# Patient Record
Sex: Female | Born: 1965
Health system: Southern US, Community
[De-identification: ages and names within clinical notes are randomized; demographics above are authoritative.]

## PROBLEM LIST (undated history)

## (undated) DIAGNOSIS — F32A Depression, unspecified: Secondary | ICD-10-CM

## (undated) DIAGNOSIS — I499 Cardiac arrhythmia, unspecified: Secondary | ICD-10-CM

## (undated) DIAGNOSIS — K5190 Ulcerative colitis, unspecified, without complications: Secondary | ICD-10-CM

## (undated) DIAGNOSIS — F419 Anxiety disorder, unspecified: Secondary | ICD-10-CM

## (undated) DIAGNOSIS — J309 Allergic rhinitis, unspecified: Secondary | ICD-10-CM

## (undated) DIAGNOSIS — I493 Ventricular premature depolarization: Secondary | ICD-10-CM

## (undated) DIAGNOSIS — F329 Major depressive disorder, single episode, unspecified: Secondary | ICD-10-CM

## (undated) HISTORY — DX: Ventricular premature depolarization: I49.3

## (undated) HISTORY — DX: Ulcerative colitis, unspecified, without complications: K51.90

## (undated) HISTORY — DX: Allergic rhinitis, unspecified: J30.9

## (undated) HISTORY — PX: OTHER SURGICAL HISTORY: SHX169

## (undated) HISTORY — PX: COLONOSCOPY: SHX174

---

## 1999-10-06 ENCOUNTER — Other Ambulatory Visit: Admission: RE | Admit: 1999-10-06 | Discharge: 1999-10-06 | Payer: Self-pay | Admitting: Gynecology

## 1999-12-26 ENCOUNTER — Ambulatory Visit (HOSPITAL_COMMUNITY): Admission: RE | Admit: 1999-12-26 | Discharge: 1999-12-26 | Payer: Self-pay | Admitting: Gastroenterology

## 1999-12-26 ENCOUNTER — Encounter (INDEPENDENT_AMBULATORY_CARE_PROVIDER_SITE_OTHER): Payer: Self-pay

## 2000-12-10 ENCOUNTER — Other Ambulatory Visit: Admission: RE | Admit: 2000-12-10 | Discharge: 2000-12-10 | Payer: Self-pay | Admitting: Gynecology

## 2001-06-18 ENCOUNTER — Encounter: Admission: RE | Admit: 2001-06-18 | Discharge: 2001-06-18 | Payer: Self-pay | Admitting: Gynecology

## 2001-06-18 ENCOUNTER — Encounter: Payer: Self-pay | Admitting: Gynecology

## 2001-12-19 ENCOUNTER — Other Ambulatory Visit: Admission: RE | Admit: 2001-12-19 | Discharge: 2001-12-19 | Payer: Self-pay | Admitting: Gynecology

## 2002-12-17 ENCOUNTER — Other Ambulatory Visit: Admission: RE | Admit: 2002-12-17 | Discharge: 2002-12-17 | Payer: Self-pay | Admitting: Gynecology

## 2003-06-17 ENCOUNTER — Encounter: Admission: RE | Admit: 2003-06-17 | Discharge: 2003-06-17 | Payer: Self-pay | Admitting: Family Medicine

## 2003-11-24 ENCOUNTER — Other Ambulatory Visit: Admission: RE | Admit: 2003-11-24 | Discharge: 2003-11-24 | Payer: Self-pay | Admitting: Gynecology

## 2004-01-11 ENCOUNTER — Ambulatory Visit (HOSPITAL_COMMUNITY): Admission: RE | Admit: 2004-01-11 | Discharge: 2004-01-11 | Payer: Self-pay | Admitting: Gastroenterology

## 2004-09-21 ENCOUNTER — Ambulatory Visit (HOSPITAL_COMMUNITY): Admission: RE | Admit: 2004-09-21 | Discharge: 2004-09-21 | Payer: Self-pay | Admitting: Gastroenterology

## 2004-10-03 ENCOUNTER — Other Ambulatory Visit: Admission: RE | Admit: 2004-10-03 | Discharge: 2004-10-03 | Payer: Self-pay | Admitting: Gynecology

## 2005-02-27 ENCOUNTER — Ambulatory Visit (HOSPITAL_COMMUNITY): Admission: RE | Admit: 2005-02-27 | Discharge: 2005-02-27 | Payer: Self-pay | Admitting: Gastroenterology

## 2005-02-27 ENCOUNTER — Encounter (INDEPENDENT_AMBULATORY_CARE_PROVIDER_SITE_OTHER): Payer: Self-pay | Admitting: Specialist

## 2005-03-27 ENCOUNTER — Encounter: Admission: RE | Admit: 2005-03-27 | Discharge: 2005-03-27 | Payer: Self-pay | Admitting: Gynecology

## 2005-04-12 ENCOUNTER — Ambulatory Visit (HOSPITAL_COMMUNITY): Admission: RE | Admit: 2005-04-12 | Discharge: 2005-04-12 | Payer: Self-pay | Admitting: Gynecology

## 2005-09-26 ENCOUNTER — Other Ambulatory Visit: Admission: RE | Admit: 2005-09-26 | Discharge: 2005-09-26 | Payer: Self-pay | Admitting: Gynecology

## 2006-04-02 ENCOUNTER — Encounter: Admission: RE | Admit: 2006-04-02 | Discharge: 2006-04-02 | Payer: Self-pay | Admitting: Gynecology

## 2006-07-17 ENCOUNTER — Ambulatory Visit (HOSPITAL_BASED_OUTPATIENT_CLINIC_OR_DEPARTMENT_OTHER): Admission: RE | Admit: 2006-07-17 | Discharge: 2006-07-17 | Payer: Self-pay | Admitting: Orthopedic Surgery

## 2006-09-12 ENCOUNTER — Other Ambulatory Visit: Admission: RE | Admit: 2006-09-12 | Discharge: 2006-09-12 | Payer: Self-pay | Admitting: Gynecology

## 2007-04-11 ENCOUNTER — Encounter: Admission: RE | Admit: 2007-04-11 | Discharge: 2007-04-11 | Payer: Self-pay | Admitting: Gynecology

## 2007-07-10 ENCOUNTER — Ambulatory Visit (HOSPITAL_COMMUNITY): Admission: RE | Admit: 2007-07-10 | Discharge: 2007-07-10 | Payer: Self-pay | Admitting: Gynecology

## 2007-09-24 ENCOUNTER — Other Ambulatory Visit: Admission: RE | Admit: 2007-09-24 | Discharge: 2007-09-24 | Payer: Self-pay | Admitting: Gynecology

## 2008-04-21 ENCOUNTER — Encounter: Admission: RE | Admit: 2008-04-21 | Discharge: 2008-04-21 | Payer: Self-pay | Admitting: Gynecology

## 2008-05-05 ENCOUNTER — Encounter (INDEPENDENT_AMBULATORY_CARE_PROVIDER_SITE_OTHER): Payer: Self-pay | Admitting: Gastroenterology

## 2008-05-05 ENCOUNTER — Ambulatory Visit (HOSPITAL_COMMUNITY): Admission: RE | Admit: 2008-05-05 | Discharge: 2008-05-05 | Payer: Self-pay | Admitting: Gastroenterology

## 2009-04-01 ENCOUNTER — Encounter: Admission: RE | Admit: 2009-04-01 | Discharge: 2009-04-01 | Payer: Self-pay | Admitting: Gynecology

## 2009-05-14 ENCOUNTER — Encounter: Admission: RE | Admit: 2009-05-14 | Discharge: 2009-05-14 | Payer: Self-pay | Admitting: Family Medicine

## 2009-06-01 ENCOUNTER — Ambulatory Visit (HOSPITAL_COMMUNITY): Admission: RE | Admit: 2009-06-01 | Discharge: 2009-06-01 | Payer: Self-pay | Admitting: Gynecology

## 2010-03-07 ENCOUNTER — Encounter: Admission: RE | Admit: 2010-03-07 | Discharge: 2010-03-07 | Payer: Self-pay | Admitting: Gynecology

## 2010-04-27 ENCOUNTER — Ambulatory Visit (HOSPITAL_COMMUNITY)
Admission: RE | Admit: 2010-04-27 | Discharge: 2010-04-27 | Payer: Self-pay | Source: Home / Self Care | Admitting: Gynecology

## 2010-06-26 ENCOUNTER — Encounter: Payer: Self-pay | Admitting: Gastroenterology

## 2010-08-29 ENCOUNTER — Other Ambulatory Visit: Payer: Self-pay | Admitting: Gastroenterology

## 2010-08-30 ENCOUNTER — Ambulatory Visit
Admission: RE | Admit: 2010-08-30 | Discharge: 2010-08-30 | Disposition: A | Discharge status: Home / Self Care | Payer: BC Managed Care – PPO | Source: Ambulatory Visit | Attending: Gastroenterology | Admitting: Gastroenterology

## 2010-08-31 ENCOUNTER — Other Ambulatory Visit (HOSPITAL_COMMUNITY): Payer: Self-pay | Admitting: Gastroenterology

## 2010-08-31 DIAGNOSIS — R1084 Generalized abdominal pain: Secondary | ICD-10-CM

## 2010-09-09 ENCOUNTER — Encounter (HOSPITAL_COMMUNITY)
Admission: RE | Admit: 2010-09-09 | Discharge: 2010-09-09 | Disposition: A | Discharge status: Home / Self Care | Payer: BC Managed Care – PPO | Source: Ambulatory Visit | Attending: Gastroenterology | Admitting: Gastroenterology

## 2010-09-09 DIAGNOSIS — R1084 Generalized abdominal pain: Secondary | ICD-10-CM

## 2010-09-09 MED ORDER — TECHNETIUM TC 99M MEBROFENIN IV KIT
5.0000 | PACK | Freq: Once | INTRAVENOUS | Status: AC | PRN
  Administered 2010-09-09: 5 via INTRAVENOUS

## 2010-09-14 ENCOUNTER — Other Ambulatory Visit: Payer: Self-pay | Admitting: Gastroenterology

## 2010-10-18 NOTE — Op Note (Signed)
NAMECLARISSE, RODRIGES            ACCOUNT NO.:  [id number]   MEDICAL RECORD NO.:  [id number]          PATIENT TYPE:  AMB   LOCATION:  ENDO                         FACILITY:  [facility]   PHYSICIAN:  Petra Kuba, M.D.    DATE OF BIRTH:  Sep 14, 1965   DATE OF PROCEDURE:  05/05/2008  DATE OF DISCHARGE:                               OPERATIVE REPORT   PROCEDURE:  Colonoscopy with biopsy.   INDICATIONS:  The patient with bright red blood per rectum, history of  ulcerative colitis, almost due for repeat colitis screening.  Consent  was signed after risks, benefits, methods, and options thoroughly  discussed multiple times in the past.   MEDICINES USED:  Benadryl 12.5, Fentanyl 125 mcg, and Versed 12.5 mg.   PROCEDURE:  Rectal inspection is pertinent for external hemorrhoids,  small.  Digital exam was negative.  Pediatric video colonoscope was  inserted, and easily advanced around the colon to the cecum.  This did  not require any abdominal pressure or any position changes.  On  insertion, some mild patchy colitis was seen, although the rectum was  normal.  The cecum was identified by the appendiceal orifice and the  ileocecal valve.  Fiberscope was inserted a short way in the terminal  ileum, which was normal.  Better documentation was obtained.  The scope  was slowly withdrawn.  The cecum, ascending, and majority of the  transverse had very mild colitis with some cobblestoning and some  friability, no obvious ulcerations, and a few scattered biopsies were  obtained and put in the first container.  The area around the splenic  flexure seemed to be normal with some patchy increased erythema and some  shallow ulcerations on the left side of the colon.  Left-sided biopsies  were obtained and put into a separate container.  No masses or polyps  were seen as we slowly withdrew back to the rectum, again the distal  sigmoid and rectum appeared more normal.  Anorectal pull-through and  retroflexion confirmed some tiny hemorrhoids.  Scope was drained and  readvanced towards the left side of the colon.  Air was suctioned and  scope was removed.  The patient tolerated the procedure well.  There was  no obvious immediate complications.   ENDOSCOPIC DIAGNOSES:  1. Tiny hemorrhoids.  2. Patchy colitis, mild with normal rectal, distal sigmoid, and      proximal level around the splenic flexure, status post biopsies      have involved areas separated between left and right.  3. Normal terminal ileum.   PLAN:  Await pathology.  We will re-discuss 5-ASA versus Entocort, which  she has been on before and follow up in 1 month, have her call me sooner  p.r.n.           ______________________________  Petra Kuba, M.D.     MEM/MEDQ  D:  05/05/2008  T:  05/06/2008  Job:  [id number]

## 2010-10-21 NOTE — Procedures (Signed)
[facility]  Patient:    Christina Logan, Christina Logan                   MRN: [id number] Proc. Date: 12/26/99 Adm. Date:  [id number] Disc. Date: [id number] Attending:  Deneen Harts CC:         Leatha Gilding. Mezer, M.D.             Duncan Dull, M.D.                           Procedure Report  PROCEDURE:  Colonoscopy.  INDICATION FOR PROCEDURE:  A 45 year old white female with prior history of inflammatory bowel disease, diagnosed with ulcerative colitis during teenage years, remotely. The patient returns to medical attention because of new onset of hematochezia. This was initially treated symptomatically with Anusol-HC suppositories. Because of persistent symptoms undergoing colonoscopy to reassess inflammatory bowel disease and further diagnose etiology of hematochezia.  DESCRIPTION OF PROCEDURE:  After reviewing the nature of the procedure with the patient including potential risks and complications, and after discussing alternative methods of diagnosis and treatment, informed consent was signed. In particular, we discussed the possibilities of perforation and hemorrhage.  Using the Olympus pediatric video colonoscope, the rectum was intubated after a normal digital examination. The scope was advanced easily around the entire length of the colon to the cecum which was identified by the appendiceal orifice and ileocecal valve. Excellent preparation throughout. The terminal ileum was intubated over its distal 10 cm which appeared entirely normal. Random biopsies were obtained to rule out Crohns disease. The scope was withdrawn into the cecum. The mucosa appeared normal. Random biopsies were obtained. The scope was slowly withdrawn with careful inspection of the entire colon in a retrograde manner including retroflexed view in the rectal vault. No abnormality was noted. Specifically, there was no evidence of mucosal inflammation to suggest inflammatory bowel  disease. Biopsies were taken from the terminal ileum, cecum, and from the rectum for microscopic study. There was no evidence of neoplasia, mucosal inflammation, vascular lesion, or diverticular disease. Retroflexed view in the vault was normal. There was no lesion to explain hematochezia. The colon was decompressed, scope withdrawn.  The patient tolerated the procedure without difficulty being maintained on DataScope monitor and low flow oxygen throughout.  Time 1, technical 2, preparation 1, total score equal 4.  ASSESSMENT: 1. Normal ileoscopy. 2. Normal colonoscopy. 3. Random mucosal biopsies--terminal ileum, cecum, rectum. 4. Hematochezia, probable intermittent anorectal fissure.  RECOMMENDATIONS: 1. Follow-up pathology. 2. Return office visit in 1 month to review pathology results and to reassess    clinical course. 3. Rectal care p.r.n.--include use of Anusol-HC suppository b.i.d. which    have worked in the recent past. DD:  12/26/99 TD:  12/29/99 Job: [id number]

## 2010-10-21 NOTE — Op Note (Signed)
Christina Logan, Christina Logan            ACCOUNT NO.:  [id number]   MEDICAL RECORD NO.:  [id number]          PATIENT TYPE:  AMB   LOCATION:  ENDO                         FACILITY:  [facility]   PHYSICIAN:  Petra Kuba, M.D.    DATE OF BIRTH:  07-Jun-1965   DATE OF PROCEDURE:  02/27/2005  DATE OF DISCHARGE:                                 OPERATIVE REPORT   PROCEDURE PERFORMED:  Colonoscopy with biopsy.   ENDOSCOPIST:  Petra Kuba, M.D.   INDICATIONS FOR PROCEDURE:  Patient with longstanding ulcerative colitis  currently in remission but with left upper quadrant pain.  Consent was  signed after the risks, benefits, methods and options were thoroughly  discussed in the office.   MEDICINES USED:  Demerol 125 mg, Versed 12.5 mg.   DESCRIPTION OF PROCEDURE:  Rectal inspection was pertinent for small  external hemorrhoids.  Digital exam was negative.  A video pediatric  adjustable colonoscope was inserted and easily advanced around the colon to  the cecum.  No signs of colitis or abnormality were seen on insertion.  The  cecum was identified by the appendiceal orifice and the ileocecal valve.  In  fact, the scope was inserted a short ways in the terminal ileum which was  normal.  Photodocumentation and a few random biopsies were obtained and put  in the first container.  The scope was slowly withdrawn.  The colon appeared  normal.  No signs of colitis.  Random right-sided biopsies were obtained and  put in the second container.  As the scope was withdrawn around the left  side of the colon, some random biopsies were obtained and put in the third  container.  There were a few hyperplastic appearing left-sided polyps which  were also cold biopsied and put in the third container.  No other  abnormalities were seen as we slowly withdrew back to the rectum.  Anorectal  pullthrough and retroflexion confirmed some small hemorrhoids.  Scope was  straightened and readvanced a short ways up the left side  of the colon.  Air  was suctioned, scope removed.  The patient tolerated the procedure well.  There was no immediate obvious complication.   ENDOSCOPIC DIAGNOSIS:  1.  Internal and external small hemorrhoids.  2.  Three tiny left-sided hyperplastic appearing polyps cold biopsied.  3.  Otherwise within normal limits to the terminal ileum, status post random      biopsies throughout.   PLAN:  Await pathology.  Follow up p.r.n. or in two to three months.  Might  consider a laparoscope if pain continues to rule out recurrent  endometriosis, adhesions, etc.           ______________________________  Petra Kuba, M.D.     MEM/MEDQ  D:  02/27/2005  T:  02/28/2005  Job:  [id number]   cc:   Duncan Dull, M.D.  Fax: [phone]   Aundra Dubin, M.D.  [address]  [place]  [place] [address]

## 2010-10-21 NOTE — Op Note (Signed)
Christina Logan, Christina Logan            ACCOUNT NO.:  [id number]   MEDICAL RECORD NO.:  [id number]          PATIENT TYPE:  AMB   LOCATION:  DSC                          FACILITY:  [facility]   PHYSICIAN:  Leonides Grills, M.D.     DATE OF BIRTH:  [birth year]   DATE OF PROCEDURE:  07/17/2006  DATE OF DISCHARGE:                               OPERATIVE REPORT   PREOPERATIVE DIAGNOSIS:  Right hallux valgus.   POSTOPERATIVE DIAGNOSIS:  Right hallux valgus.   OPERATION:  1. Right Chevron bunionectomy.  2. Stress x-rays right foot.   ANESTHESIA:  General.   SURGEON:  Leonides Grills, MD   ASSISTANT:  Evlyn Kanner PA-C   ESTIMATED BLOOD LOSS:  Minimal.   TOURNIQUET TIME:  Approximately 40 minutes.   COMPLICATIONS:  None.   DISPOSITION:  Stable to PR.   INDICATIONS:  This is a 45 year old female, with a longstanding right  medial great toe pain as well as a plantar great toe pain and was  interfering with her life to the point where she could not do what she  wants to do.  She was consented for the above procedure.  All risks  including infection, nerve or vessel injury, nonunion, malunion,  hardware irritation, hardware failure, persistent pain, worsening pain,  prolonged recovery, stiffness, arthritis and recurrence of deformity  were all explained, questions were encouraged and answered.   OPERATION:  Patient brought to the operating room, placed in supine  position after adequate general tube anesthesia was administered as well  Ancef 1 gram IV piggyback.  Right lower extremity was then prepped and  draped in sterile manner over a proximally thigh tourniquet.  Limb was  gravity exsanguinated and tourniquet elevated to 290 mmHg.  A  longitudinal incision midline over medial aspect of right great toe MTP  joint was then made.  Dissection was carried down through skin.  Hemostasis was obtained.  Neurovascular structures identified both  superiorly and inferiorly and protected throughout  the case.  L-shaped  capsulotomy was then made.  Simple bunionectomy was then performed with  a sagittal saw.  Rocky Link Johnson's ridge was then rounded off with a  rongeur.  We then entered the joint and lateral capsule release was then  performed with a curved Beaver blade protecting the cartilage with a  Freer.  This had excellent release of tight capsule.  There was a large  amount of synovitis within the joint.  There was actually a flap of soft  tissue in both the medial and lateral aspects at the base of proximal  phalanx.  This was carefully debrided as well.  Again also a complete  synovectomy around the sesamoids was performed and into the proximal  plantar pouch of the MTP joint.  There was a large amount of synovitis.  Once this was all removed, there was a small spur the plantar base of  the proximal phalanx that was removed as well.  The joint was ranged and  had excellent range of motion without any impingement.  The area and  joint were copiously irrigated with normal saline.  Center-center of the  head was then identified.  A Chevron osteotomy is then created with a  sagittal saw.  The head was then translated approximately 3 mm laterally  and fixed with a 2.0 mm fully threaded cortical set screw using 1.5 mm  drill respectively.  This was countersunk.  This was a 16 mm long screw.  This had excellent purchase and maintenance of the correction.  Redundant bone medially was then trimmed off with a sagittal saw.  The  joint and area were copiously irrigated with normal saline.  The capsule  was advanced both superiorly and proximally and reconstructed with 2-0  Vicryl stitch.  This was found to have outstanding repair.  Range of  motion of toe was excellent.  Final stress x-rays in the AP and lateral  planes were obtained and showed no gross motion across the osteotomy  site.  Fixation proposition correction with excellent as well.  Sesamoids located.  Tourniquet deflated.   Hemostasis was obtained.  Skin  was closed with 4-0 nylon stitch.  Sterile dressing was applied.  Roger  Mann dressing was applied.  Hard sole shoe was applied.  Patient stable  to PR.      Leonides Grills, M.D.  Electronically Signed     PB/MEDQ  D:  07/17/2006  T:  07/17/2006  Job:  [id number]

## 2011-04-10 ENCOUNTER — Other Ambulatory Visit: Payer: Self-pay | Admitting: Gynecology

## 2011-04-10 DIAGNOSIS — Z1231 Encounter for screening mammogram for malignant neoplasm of breast: Secondary | ICD-10-CM

## 2011-05-01 ENCOUNTER — Other Ambulatory Visit: Payer: Self-pay | Admitting: Gynecology

## 2011-05-01 DIAGNOSIS — Z803 Family history of malignant neoplasm of breast: Secondary | ICD-10-CM

## 2011-05-02 ENCOUNTER — Ambulatory Visit
Admission: RE | Admit: 2011-05-02 | Discharge: 2011-05-02 | Disposition: A | Discharge status: Home / Self Care | Payer: BC Managed Care – PPO | Source: Ambulatory Visit | Attending: Gynecology | Admitting: Gynecology

## 2011-05-02 DIAGNOSIS — Z1231 Encounter for screening mammogram for malignant neoplasm of breast: Secondary | ICD-10-CM

## 2011-05-16 ENCOUNTER — Ambulatory Visit
Admission: RE | Admit: 2011-05-16 | Discharge: 2011-05-16 | Disposition: A | Discharge status: Home / Self Care | Payer: BC Managed Care – PPO | Source: Ambulatory Visit | Attending: Gynecology | Admitting: Gynecology

## 2011-05-16 DIAGNOSIS — Z803 Family history of malignant neoplasm of breast: Secondary | ICD-10-CM

## 2011-05-16 MED ORDER — GADOBENATE DIMEGLUMINE 529 MG/ML IV SOLN
17.0000 mL | Freq: Once | INTRAVENOUS | Status: AC | PRN
  Administered 2011-05-16: 17 mL via INTRAVENOUS

## 2011-06-01 ENCOUNTER — Other Ambulatory Visit: Payer: Self-pay | Admitting: Gastroenterology

## 2011-06-01 ENCOUNTER — Ambulatory Visit
Admission: RE | Admit: 2011-06-01 | Discharge: 2011-06-01 | Disposition: A | Discharge status: Home / Self Care | Payer: BC Managed Care – PPO | Source: Ambulatory Visit | Attending: Gastroenterology | Admitting: Gastroenterology

## 2011-06-01 DIAGNOSIS — R1031 Right lower quadrant pain: Secondary | ICD-10-CM

## 2011-06-01 MED ORDER — IOHEXOL 300 MG/ML  SOLN
100.0000 mL | Freq: Once | INTRAMUSCULAR | Status: AC | PRN
  Administered 2011-06-01: 100 mL via INTRAVENOUS

## 2011-09-06 ENCOUNTER — Encounter (INDEPENDENT_AMBULATORY_CARE_PROVIDER_SITE_OTHER): Payer: BC Managed Care – PPO | Admitting: Obstetrics and Gynecology

## 2011-09-06 DIAGNOSIS — N951 Menopausal and female climacteric states: Secondary | ICD-10-CM

## 2011-09-21 ENCOUNTER — Encounter: Payer: Self-pay | Admitting: *Deleted

## 2011-09-21 ENCOUNTER — Telehealth: Payer: Self-pay | Admitting: *Deleted

## 2011-09-21 NOTE — Telephone Encounter (Signed)
Confirmed 10/02/11 genetics appt w/ pt.  Left message for Clydie Braun at referring to make her aware.

## 2011-09-21 NOTE — Progress Notes (Signed)
Took paper work to [employer] for [employer].

## 2011-10-02 ENCOUNTER — Ambulatory Visit (HOSPITAL_BASED_OUTPATIENT_CLINIC_OR_DEPARTMENT_OTHER): Payer: BC Managed Care – PPO | Admitting: Genetic Counselor

## 2011-10-02 ENCOUNTER — Other Ambulatory Visit: Payer: BC Managed Care – PPO | Admitting: Lab

## 2011-10-02 DIAGNOSIS — Z803 Family history of malignant neoplasm of breast: Secondary | ICD-10-CM | POA: Insufficient documentation

## 2011-10-02 NOTE — Progress Notes (Signed)
Dr. Chevis Pretty requested a consultation for genetic counseling and risk assessment for Christina Logan, a 46 y.o. female, for discussion of her family history of breast cancer. She presents to clinic today to discuss the possibility of a genetic predisposition to cancer, and to further clarify her risks, as well as her family members' risks for cancer.   HISTORY OF PRESENT ILLNESS: Christina Logan is a 46 y.o. female with no personal history of cancer.    No past medical history on file.  No past surgical history on file.  History  Substance Use Topics  . Smoking status: Not on file  . Smokeless tobacco: Not on file  . Alcohol Use: Not on file    REPRODUCTIVE HISTORY AND PERSONAL RISK ASSESSMENT FACTORS: Head Circumference: 58 CM Menarche was at age 67.   Pre-menopausal Uterus Intact: Yes Ovaries Intact: Yes G0P0A0 , first live birth at age N/A  She has previously undergone treatment for infertility.   OCP use for 20 years   She has not used HRT in the past.    FAMILY HISTORY:  We obtained a detailed, 4-generation family history.  Significant diagnoses are listed below: The patient has not been diangosed with breast cancer.  She has a 12 year old daughter who is biologically her niece.  The patient's sister was diagnosed with breast cancer at age 73.  She is currently 45 and has an 41 year old daughter with Autism.  The patient's mother was diagnosed at age 76 with breast cancer, her maternal grandmother was diagnosed with thyroid cancer between 35-40, breast cancer at 75 and lung cancer at 96.  It is thought that the lung cancer is a primary cancer.  The patient's maternal great grandmother also had breast cancer diagnosed over the age of 19.  The patient's maternal grandfather was diagnosed with glioblastoma at 66 and died at 52.  Patient's maternal ancestors are of [place] descent, and paternal ancestors are of unknown descent. There is no reported Ashkenazi Jewish ancestry.  There is no  known consanguinity.  GENETIC COUNSELING RISK ASSESSMENT, DISCUSSION, AND SUGGESTED FOLLOW UP: We reviewed the natural history and genetic etiology of sporadic, familial and hereditary cancer syndromes.  Approximately 5-10% of breast cancer is hereditary, and of this about 85% is the result of BRCA1 and 2 mutations.  When breast cancer under the age of 1 is diagnosed, there is about a 4% chance that there is a TP53 mutation Burgess Amor).  Additionally, thyroid cancer, breast cancer, and autism can be seen in Cowden syndrome.  The patient has a HC of 58 cm.  We reviewed the red flags of hereditary cancer syndromes and the dominant inheritance pattern.  We also discussed that the patient's mother or sister would be the most informative person to test, however, the sister is estranged from the family and her mother's insurance would not pay for genetic testing.  Based on the family history, we will refer the patient to a high risk breast clinic.  The patient's family history is suggestive of the following possible diagnosis: hereditary breast cancer  We discussed that identification of a hereditary cancer syndrome may help her care providers tailor the patients medical management. If a mutation indicating hereditary breast cancer is detected in this case, the [employer] recommendations would include increased cancer screening and possible prophylactic surgery. If a mutation is detected, the patient will be referred back to the referring provider and to any additional appropriate care providers to  discuss the relevant options.   If a mutation is not found in the patient, this will decrease the likelihood of hereditary breast cacner as the explanation for her family history of breast cancer.  Although, having her sister or mother be tested would allow [place] to know if her test was a true negative test. Cancer surveillance options would be discussed for the patient according  to the appropriate standard National Comprehensive Cancer Network and American Cancer Society guidelines, with consideration of their personal and family history risk factors. In this case, the patient will be referred back to their care providers for discussions of management.   In order to estimate her chance of having a BRCA1 or BRCA2 mutation, we used statistical models (Penn II) and laboratory data that take into account her personal medical history, family history and ancestry.  Because each model is different, there can be a lot of variability in the risks they give.  Therefore, these numbers must be considered a rough range and not a precise risk of having a BRCA1 or BRCA2 mutation.  These models estimate that she has approximately a 11% chance of having a mutation. Specifically, there is a 7% chance of testing positive for a BRCA1 mutation and a 4% chance of testing positive for a BRCA2 mutation.  Based on this assessment of her family and personal history, genetic testing is recommended.  Based on the patient's personal and family history, statistical models (tyrer-cuzik)  and literature data were used to estimate her risk of developing breast cancer. These estimate her lifetime risk of developing breast cancer to be approximately 35.75%. This estimation does not take into account any genetic testing results.   After considering the risks, benefits, and limitations, the patient provided informed consent for  the following  Testing:  BRCAnalysis through [employer] [facility] and CancerNext panel testing through [employer]   Per the patient's request, we will contact her by telephone to discuss these results. A follow up genetic counseling visit will be scheduled if indicated.  The patient was seen for a total of 60 minutes, greater than 50% of which was spent face-to-face counseling.  This plan is being carried out per Dr. Leeroy Cha recommendations.  This note will also be sent to the referring  provider via the electronic medical record. The patient will be supplied with a summary of this genetic counseling discussion as well as educational information on the discussed hereditary cancer syndromes following the conclusion of their visit.   Patient was discussed with Dr. Drue Second.   EDUCATIONAL INFORMATION SUPPLIED TO PATIENT AT ENCOUNTER:  Hereditary breast and ovarian cancer syndrome brochure  _______________________________________________________________________ For Office Staff:  Number of people involved in session: 3 Was an Intern/ student involved with case: yes

## 2011-10-09 ENCOUNTER — Encounter: Payer: Self-pay | Admitting: Genetic Counselor

## 2011-10-18 ENCOUNTER — Encounter: Payer: Self-pay | Admitting: Genetic Counselor

## 2011-11-17 ENCOUNTER — Encounter: Payer: Self-pay | Admitting: *Deleted

## 2011-11-17 NOTE — Progress Notes (Signed)
Faxed genetic notes and results to Clydie Braun at referring office.

## 2012-01-15 ENCOUNTER — Encounter: Payer: Self-pay | Admitting: Genetic Counselor

## 2012-01-15 ENCOUNTER — Telehealth: Payer: Self-pay | Admitting: Genetic Counselor

## 2012-01-15 NOTE — Telephone Encounter (Signed)
Left good news message on genetic testing results.

## 2012-01-18 ENCOUNTER — Telehealth: Payer: Self-pay | Admitting: Genetic Counselor

## 2012-01-18 NOTE — Telephone Encounter (Signed)
Revealed normal Cancer Next results.  Discussed that while reassuring for her, it is not informative for her family.  Suggested that we consider testing her mother once she gets insurance, or her sister.  She informed me that her sister died in November 18, 2022.

## 2012-05-06 ENCOUNTER — Other Ambulatory Visit: Payer: Self-pay | Admitting: Gynecology

## 2012-05-06 DIAGNOSIS — Z1231 Encounter for screening mammogram for malignant neoplasm of breast: Secondary | ICD-10-CM

## 2012-06-10 ENCOUNTER — Ambulatory Visit
Admission: RE | Admit: 2012-06-10 | Discharge: 2012-06-10 | Disposition: A | Discharge status: Home / Self Care | Payer: BC Managed Care – PPO | Source: Ambulatory Visit | Attending: Gynecology | Admitting: Gynecology

## 2012-06-10 DIAGNOSIS — Z1231 Encounter for screening mammogram for malignant neoplasm of breast: Secondary | ICD-10-CM

## 2012-07-04 ENCOUNTER — Other Ambulatory Visit: Payer: Self-pay | Admitting: Gynecology

## 2012-07-04 DIAGNOSIS — Z803 Family history of malignant neoplasm of breast: Secondary | ICD-10-CM

## 2012-07-12 ENCOUNTER — Ambulatory Visit
Admission: RE | Admit: 2012-07-12 | Discharge: 2012-07-12 | Disposition: A | Discharge status: Home / Self Care | Payer: 59 | Source: Ambulatory Visit | Attending: Gynecology | Admitting: Gynecology

## 2012-07-12 DIAGNOSIS — Z803 Family history of malignant neoplasm of breast: Secondary | ICD-10-CM

## 2012-07-12 MED ORDER — GADOBENATE DIMEGLUMINE 529 MG/ML IV SOLN
17.0000 mL | Freq: Once | INTRAVENOUS | Status: AC | PRN
  Administered 2012-07-12: 17 mL via INTRAVENOUS

## 2012-07-16 ENCOUNTER — Other Ambulatory Visit: Payer: Self-pay | Admitting: Gynecology

## 2012-07-16 DIAGNOSIS — R928 Other abnormal and inconclusive findings on diagnostic imaging of breast: Secondary | ICD-10-CM

## 2012-07-22 ENCOUNTER — Ambulatory Visit
Admission: RE | Admit: 2012-07-22 | Discharge: 2012-07-22 | Disposition: A | Discharge status: Home / Self Care | Payer: 59 | Source: Ambulatory Visit | Attending: Gynecology | Admitting: Gynecology

## 2012-07-22 DIAGNOSIS — R928 Other abnormal and inconclusive findings on diagnostic imaging of breast: Secondary | ICD-10-CM

## 2012-07-22 MED ORDER — GADOBENATE DIMEGLUMINE 529 MG/ML IV SOLN
17.0000 mL | Freq: Once | INTRAVENOUS | Status: AC | PRN
  Administered 2012-07-22: 17 mL via INTRAVENOUS

## 2012-09-13 ENCOUNTER — Ambulatory Visit: Payer: Self-pay

## 2012-09-13 ENCOUNTER — Other Ambulatory Visit: Payer: Self-pay | Admitting: Occupational Medicine

## 2012-09-13 DIAGNOSIS — R52 Pain, unspecified: Secondary | ICD-10-CM

## 2012-10-03 ENCOUNTER — Other Ambulatory Visit: Payer: Self-pay | Admitting: Gastroenterology

## 2013-01-22 ENCOUNTER — Other Ambulatory Visit: Payer: Self-pay | Admitting: Gynecology

## 2013-01-22 DIAGNOSIS — Z09 Encounter for follow-up examination after completed treatment for conditions other than malignant neoplasm: Secondary | ICD-10-CM

## 2013-01-31 ENCOUNTER — Ambulatory Visit
Admission: RE | Admit: 2013-01-31 | Discharge: 2013-01-31 | Disposition: A | Discharge status: Home / Self Care | Payer: 59 | Source: Ambulatory Visit | Attending: Gynecology | Admitting: Gynecology

## 2013-01-31 ENCOUNTER — Other Ambulatory Visit: Payer: Self-pay

## 2013-01-31 DIAGNOSIS — Z09 Encounter for follow-up examination after completed treatment for conditions other than malignant neoplasm: Secondary | ICD-10-CM

## 2013-01-31 MED ORDER — GADOBENATE DIMEGLUMINE 529 MG/ML IV SOLN
17.0000 mL | Freq: Once | INTRAVENOUS | Status: AC | PRN
  Administered 2013-01-31: 17 mL via INTRAVENOUS

## 2013-07-04 ENCOUNTER — Other Ambulatory Visit: Payer: Self-pay

## 2013-07-04 DIAGNOSIS — Z1231 Encounter for screening mammogram for malignant neoplasm of breast: Secondary | ICD-10-CM

## 2013-07-18 ENCOUNTER — Ambulatory Visit: Payer: 59

## 2013-07-25 ENCOUNTER — Ambulatory Visit
Admission: RE | Admit: 2013-07-25 | Discharge: 2013-07-25 | Disposition: A | Discharge status: Home / Self Care | Payer: 59 | Source: Ambulatory Visit

## 2013-07-25 DIAGNOSIS — Z1231 Encounter for screening mammogram for malignant neoplasm of breast: Secondary | ICD-10-CM

## 2013-08-18 ENCOUNTER — Other Ambulatory Visit: Payer: Self-pay | Admitting: Gynecology

## 2013-08-18 DIAGNOSIS — Z803 Family history of malignant neoplasm of breast: Secondary | ICD-10-CM

## 2013-08-25 ENCOUNTER — Ambulatory Visit
Admission: RE | Admit: 2013-08-25 | Discharge: 2013-08-25 | Disposition: A | Discharge status: Home / Self Care | Payer: 59 | Source: Ambulatory Visit | Attending: Gynecology | Admitting: Gynecology

## 2013-08-25 DIAGNOSIS — Z803 Family history of malignant neoplasm of breast: Secondary | ICD-10-CM

## 2013-08-25 MED ORDER — GADOBENATE DIMEGLUMINE 529 MG/ML IV SOLN
17.0000 mL | Freq: Once | INTRAVENOUS | Status: AC | PRN
  Administered 2013-08-25: 17 mL via INTRAVENOUS

## 2013-12-18 ENCOUNTER — Encounter: Payer: Self-pay | Admitting: Cardiovascular Disease

## 2014-01-06 ENCOUNTER — Other Ambulatory Visit: Payer: Self-pay | Admitting: *Deleted

## 2014-01-06 DIAGNOSIS — R002 Palpitations: Secondary | ICD-10-CM

## 2014-01-12 ENCOUNTER — Encounter (INDEPENDENT_AMBULATORY_CARE_PROVIDER_SITE_OTHER): Payer: 59

## 2014-01-12 ENCOUNTER — Encounter: Payer: Self-pay | Admitting: *Deleted

## 2014-01-12 DIAGNOSIS — R002 Palpitations: Secondary | ICD-10-CM

## 2014-01-12 NOTE — Progress Notes (Signed)
Patient ID: Christina Logan, female   DOB: [birth year], 48 y.o.   MRN: [id number] EVO 24 hour holter monitor applied to patient.

## 2014-01-15 ENCOUNTER — Ambulatory Visit: Payer: 59 | Admitting: Cardiovascular Disease

## 2014-01-20 ENCOUNTER — Ambulatory Visit (HOSPITAL_COMMUNITY): Payer: 59 | Attending: Cardiology | Admitting: Radiology

## 2014-01-20 ENCOUNTER — Other Ambulatory Visit (HOSPITAL_COMMUNITY): Payer: Self-pay | Admitting: Family Medicine

## 2014-01-20 DIAGNOSIS — R002 Palpitations: Secondary | ICD-10-CM | POA: Diagnosis present

## 2014-01-20 NOTE — Progress Notes (Signed)
Echocardiogram performed.  

## 2014-01-21 ENCOUNTER — Encounter: Payer: Self-pay | Admitting: Cardiology

## 2014-01-21 ENCOUNTER — Ambulatory Visit (INDEPENDENT_AMBULATORY_CARE_PROVIDER_SITE_OTHER): Payer: 59 | Admitting: Cardiovascular Disease

## 2014-01-21 ENCOUNTER — Encounter: Payer: Self-pay | Admitting: Cardiovascular Disease

## 2014-01-21 ENCOUNTER — Other Ambulatory Visit (HOSPITAL_COMMUNITY): Payer: 59

## 2014-01-21 VITALS — BP 98/70 | HR 84 | Ht 68.0 in | Wt 182.0 lb

## 2014-01-21 DIAGNOSIS — R0989 Other specified symptoms and signs involving the circulatory and respiratory systems: Secondary | ICD-10-CM

## 2014-01-21 DIAGNOSIS — R0600 Dyspnea, unspecified: Secondary | ICD-10-CM

## 2014-01-21 DIAGNOSIS — R0609 Other forms of dyspnea: Secondary | ICD-10-CM

## 2014-01-21 DIAGNOSIS — Z803 Family history of malignant neoplasm of breast: Secondary | ICD-10-CM

## 2014-01-21 DIAGNOSIS — R002 Palpitations: Secondary | ICD-10-CM

## 2014-01-21 MED ORDER — PROPRANOLOL HCL 10 MG PO TABS: ORAL_TABLET | ORAL | Status: DC

## 2014-01-21 NOTE — Progress Notes (Signed)
Patient ID: Christina Logan, female   DOB: [birth year], 48 y.o.   MRN: [id number]   48 yo referred for palpitations by Dr Orland Mustard.  She has also had some dyspnea.  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  More frequent since June.  Cutting back on caffeine not helpful.  Rare ETOH and no stimulants No associated diaphoresis, chest pain, dyspnea or syncope.  Stopped taking an OTC supplement for adrenals a few months ago No family history of syncope or high risk congenital disease.  Does not feel she is an anxious person  Some dyspnea or "catching" my breath with some premature beats    Echo 01/10/14 reviewed Normal with EF 60% and no signficant valve disease  Holter reviewed and some PVCls occasional couplets no SVT, NSVT or PAF K 4.0 TSH 1.8  Hct 42.6 normal labs    ROS: Denies fever, malais, weight loss, blurry vision, decreased visual acuity, cough, sputum, SOB, hemoptysis, pleuritic pain, palpitaitons, heartburn, abdominal pain, melena, lower extremity edema, claudication, or rash.  All other systems reviewed and negative   General: Affect appropriate Healthy:  appears stated age 69: normal Neck supple with no adenopathy JVP normal no bruits no thyromegaly Lungs clear with no wheezing and good diaphragmatic motion Heart:  S1/S2 no murmur,rub, gallop or click PMI normal Abdomen: benighn, BS positve, no tenderness, no AAA no bruit.  No HSM or HJR Distal pulses intact with no bruits No edema Neuro non-focal Skin warm and dry No muscular weakness  Medications Current Outpatient Prescriptions  Medication Sig Dispense Refill  . folic acid (FOLVITE) 1 MG tablet Take 1 mg by mouth daily.      . mercaptopurine (PURINETHOL) 50 MG tablet Take 50 mg by mouth daily. Give on an empty stomach 1 hour before or 2 hours after meals. Caution: Chemotherapy.       No current facility-administered medications for this visit.    Allergies Sulfa antibiotics  Family History: Family  History  Problem Relation Age of Onset  . Breast cancer Mother   . Diverticulosis Father     Social History: History   Social History  . Marital Status: Married    Spouse Name: N/A    Number of Children: N/A  . Years of Education: N/A   Occupational History  . Not on file.   Social History Main Topics  . Smoking status: Never Smoker   . Smokeless tobacco: Not on file  . Alcohol Use: No  . Drug Use: No  . Sexual Activity: Not on file   Other Topics Concern  . Not on file   Social History Narrative  . No narrative on file    Electrocardiogram:  NSR normal ECG   Assessment and Plan

## 2014-01-21 NOTE — Assessment & Plan Note (Signed)
Exam , mamogram, and genetic testing per oncology

## 2014-01-21 NOTE — Patient Instructions (Signed)
Your physician has recommended you make the following change in your medication:   1. Start Inderal 10 mg 1/2 tablet as needed for palpitations.  Your physician has requested that you have an exercise tolerance test. For further information please visit [email or web address]. Please also follow instruction sheet, as given.  Your physician recommends that you schedule a follow-up appointment at next available (after GXT).

## 2014-01-21 NOTE — Assessment & Plan Note (Signed)
Benign sounding No obvious structural heart disease  ETT to make sure ok to exercise and assess adrenergic states influence on frequency of PVC;s  PRN inderal 5 mg given low BP.  Suggested cardiac MRI but patient is claustrophobic and will have to think about doing this

## 2014-02-27 ENCOUNTER — Ambulatory Visit (INDEPENDENT_AMBULATORY_CARE_PROVIDER_SITE_OTHER): Payer: 59 | Admitting: Physician Assistant

## 2014-02-27 DIAGNOSIS — R0600 Dyspnea, unspecified: Secondary | ICD-10-CM

## 2014-02-27 DIAGNOSIS — R002 Palpitations: Secondary | ICD-10-CM

## 2014-02-27 DIAGNOSIS — R0609 Other forms of dyspnea: Secondary | ICD-10-CM

## 2014-02-27 DIAGNOSIS — R0989 Other specified symptoms and signs involving the circulatory and respiratory systems: Secondary | ICD-10-CM

## 2014-02-27 NOTE — Progress Notes (Signed)
Exercise Treadmill Test  Pre-Exercise Testing Evaluation Rhythm: normal sinus  Rate: 79 bpm     Test  Exercise Tolerance Test Ordering MD: Jenkins Rouge, MD  Interpreting MD: Richardson Dopp, PA-C  Unique Test No: 1  Treadmill:  1  Indication for ETT: exertional dyspnea  Contraindication to ETT: No   Stress Modality: exercise - treadmill  Cardiac Imaging Performed: non   Protocol: standard Bruce - maximal  Max BP:  140/79  Max MPHR (bpm):  175 85% MPR (bpm):  149  MPHR obtained (bpm):  181 % MPHR obtained:  103  Reached 85% MPHR (min:sec):  3:50 Total Exercise Time (min-sec):  7:00  Workload in METS:  8.5 Borg Scale: 14  Reason ETT Terminated:  technical difficulties monitoring (EKG or BP)    ST Segment Analysis At Rest: normal ST segments - no evidence of significant ST depression With Exercise: non-specific ST changes  Other Information Arrhythmia:  No Angina during ETT:  absent (0) Quality of ETT:  diagnostic  ETT Interpretation:  normal - no evidence of ischemia by ST analysis  Comments: Good exercise capacity. No chest pain. Normal BP response to exercise. No ST changes to suggest ischemia.  Artifact interfered with interpretation somewhat. However, overall interpretation was adequate.  Recommendations: FU with Dr. Jenkins Rouge as directed. Signed,  Richardson Dopp, PA-C   02/27/2014 9:55 AM

## 2014-03-06 ENCOUNTER — Ambulatory Visit: Payer: 59 | Admitting: Cardiovascular Disease

## 2014-07-17 ENCOUNTER — Other Ambulatory Visit: Payer: Self-pay

## 2014-07-17 DIAGNOSIS — Z1231 Encounter for screening mammogram for malignant neoplasm of breast: Secondary | ICD-10-CM

## 2014-07-27 ENCOUNTER — Ambulatory Visit: Payer: 59

## 2014-08-25 ENCOUNTER — Ambulatory Visit
Admission: RE | Admit: 2014-08-25 | Discharge: 2014-08-25 | Disposition: A | Discharge status: Home / Self Care | Payer: 59 | Source: Ambulatory Visit

## 2014-08-25 DIAGNOSIS — Z1231 Encounter for screening mammogram for malignant neoplasm of breast: Secondary | ICD-10-CM

## 2014-10-26 ENCOUNTER — Other Ambulatory Visit: Payer: Self-pay | Admitting: Gynecology

## 2014-10-27 LAB — CYTOLOGY - PAP

## 2015-02-26 ENCOUNTER — Other Ambulatory Visit: Payer: Self-pay | Admitting: Gastroenterology

## 2015-04-05 ENCOUNTER — Other Ambulatory Visit: Payer: Self-pay | Admitting: Nurse Practitioner

## 2015-04-05 DIAGNOSIS — N6310 Unspecified lump in the right breast, unspecified quadrant: Secondary | ICD-10-CM

## 2015-04-08 ENCOUNTER — Ambulatory Visit
Admission: RE | Admit: 2015-04-08 | Discharge: 2015-04-08 | Disposition: A | Discharge status: Home / Self Care | Payer: 59 | Source: Ambulatory Visit | Attending: Nurse Practitioner | Admitting: Nurse Practitioner

## 2015-04-08 DIAGNOSIS — N6310 Unspecified lump in the right breast, unspecified quadrant: Secondary | ICD-10-CM

## 2015-05-07 ENCOUNTER — Other Ambulatory Visit: Payer: Self-pay | Admitting: General Surgery

## 2015-05-07 ENCOUNTER — Encounter: Payer: Self-pay | Admitting: Genetic Counselor

## 2015-05-07 DIAGNOSIS — Z803 Family history of malignant neoplasm of breast: Secondary | ICD-10-CM

## 2015-05-07 DIAGNOSIS — N6310 Unspecified lump in the right breast, unspecified quadrant: Secondary | ICD-10-CM

## 2015-05-14 ENCOUNTER — Ambulatory Visit
Admission: RE | Admit: 2015-05-14 | Discharge: 2015-05-14 | Disposition: A | Discharge status: Home / Self Care | Payer: 59 | Source: Ambulatory Visit | Attending: General Surgery | Admitting: General Surgery

## 2015-05-14 DIAGNOSIS — Z803 Family history of malignant neoplasm of breast: Secondary | ICD-10-CM

## 2015-05-14 DIAGNOSIS — N6310 Unspecified lump in the right breast, unspecified quadrant: Secondary | ICD-10-CM

## 2015-05-24 ENCOUNTER — Ambulatory Visit
Admission: RE | Admit: 2015-05-24 | Discharge: 2015-05-24 | Disposition: A | Discharge status: Home / Self Care | Payer: 59 | Source: Ambulatory Visit | Attending: General Surgery | Admitting: General Surgery

## 2015-05-24 DIAGNOSIS — Z803 Family history of malignant neoplasm of breast: Secondary | ICD-10-CM

## 2015-05-24 MED ORDER — GADOBENATE DIMEGLUMINE 529 MG/ML IV SOLN
17.0000 mL | Freq: Once | INTRAVENOUS | Status: AC | PRN
  Administered 2015-05-24: 17 mL via INTRAVENOUS

## 2015-06-03 ENCOUNTER — Other Ambulatory Visit: Payer: Self-pay | Admitting: General Surgery

## 2015-06-03 DIAGNOSIS — R928 Other abnormal and inconclusive findings on diagnostic imaging of breast: Secondary | ICD-10-CM

## 2015-06-09 ENCOUNTER — Ambulatory Visit
Admission: RE | Admit: 2015-06-09 | Discharge: 2015-06-09 | Disposition: A | Discharge status: Home / Self Care | Payer: 59 | Source: Ambulatory Visit | Attending: General Surgery | Admitting: General Surgery

## 2015-06-09 ENCOUNTER — Other Ambulatory Visit: Payer: Self-pay | Admitting: General Surgery

## 2015-06-09 DIAGNOSIS — R928 Other abnormal and inconclusive findings on diagnostic imaging of breast: Secondary | ICD-10-CM

## 2015-06-09 DIAGNOSIS — D0511 Intraductal carcinoma in situ of right breast: Secondary | ICD-10-CM | POA: Diagnosis not present

## 2015-06-09 DIAGNOSIS — N6489 Other specified disorders of breast: Secondary | ICD-10-CM | POA: Diagnosis not present

## 2015-06-09 MED ORDER — GADOBENATE DIMEGLUMINE 529 MG/ML IV SOLN
17.0000 mL | Freq: Once | INTRAVENOUS | Status: AC | PRN
  Administered 2015-06-09: 17 mL via INTRAVENOUS

## 2015-06-11 ENCOUNTER — Other Ambulatory Visit: Payer: 59

## 2015-06-11 ENCOUNTER — Ambulatory Visit: Payer: 59

## 2015-06-11 ENCOUNTER — Encounter: Payer: Self-pay | Admitting: Hematology

## 2015-06-11 ENCOUNTER — Ambulatory Visit (HOSPITAL_BASED_OUTPATIENT_CLINIC_OR_DEPARTMENT_OTHER): Payer: 59 | Admitting: Hematology

## 2015-06-11 ENCOUNTER — Ambulatory Visit
Admission: RE | Admit: 2015-06-11 | Discharge: 2015-06-11 | Disposition: A | Discharge status: Home / Self Care | Payer: 59 | Source: Ambulatory Visit | Attending: General Surgery | Admitting: General Surgery

## 2015-06-11 ENCOUNTER — Telehealth: Payer: Self-pay | Admitting: *Deleted

## 2015-06-11 VITALS — BP 120/81 | HR 90 | Temp 98.7°F | Resp 18 | Ht 68.0 in | Wt 178.2 lb

## 2015-06-11 DIAGNOSIS — D0511 Intraductal carcinoma in situ of right breast: Secondary | ICD-10-CM

## 2015-06-11 DIAGNOSIS — Z808 Family history of malignant neoplasm of other organs or systems: Secondary | ICD-10-CM

## 2015-06-11 DIAGNOSIS — Z803 Family history of malignant neoplasm of breast: Secondary | ICD-10-CM | POA: Diagnosis not present

## 2015-06-11 DIAGNOSIS — R928 Other abnormal and inconclusive findings on diagnostic imaging of breast: Secondary | ICD-10-CM

## 2015-06-11 DIAGNOSIS — D0510 Intraductal carcinoma in situ of unspecified breast: Secondary | ICD-10-CM | POA: Diagnosis not present

## 2015-06-11 NOTE — Telephone Encounter (Signed)
Obtained request from [facility] to schedule pt asap.  Obtained appt from Dr. Burr Medico.  Called pt and confirmed 06/11/15 appt w/ her.  Unable to mail packet - placed a note for pt to receive an intake form at time of check in.  Took notes to Dr. Burr Medico.  Emailed Jearld Fenton, Dr. Donne Hazel and [facility] to make them aware.

## 2015-06-11 NOTE — Progress Notes (Signed)
[facility]  [phone] Fax:([phone]) [place] Note   Patient Care Team: Darcus Austin, MD as PCP - General (Family Medicine) 06/11/2015   Referring physician Dr. Donne Hazel  CHIEF COMPLAINTS/PURPOSE OF CONSULTATION:  Newly diagnosed right breast DCIS   HISTORY OF PRESENTING ILLNESS:  Christina Logan 50 y.o. female is here because of her recently diagnosed right breast DCIS.  She has a strong family history of breast cancer (mother, sister, and maternal grandmother), she had genetic testing for inheritable breast cancer syndrome 3 years ago at our cancer center which was negative. She has been having annual screening mammogram which has been negative until June 2016.  She noticed a lump in the right outer lower quadrant breast about months ago, which prompted a diagnostic mammogram and ultrasound in November 2016, which showed normal fibroglandular tissue. Due to her dense breast tissue, she underwent bilateral breast MRI on 05/24/2015, which showed 3 areas of non-mass enhancement, 2.4 cm in the upper inner quadrant, 2.5 cm in the 8:30 o'clock position, and a separate 8 mm at the 3:30 clock position. She subsequently underwent MRI guided needle biopsy of the upper inner quadrant and lower outer quadrant mass, which all showed low-grade DCIS.   MEDICAL HISTORY:  Past Medical History  Diagnosis Date  . PVCs (premature ventricular contractions)   . Migraines   . Ulcerative colitis ([place])   . Allergic rhinitis     SURGICAL HISTORY: Past Surgical History  Procedure Laterality Date  . Colonoscopy      2011/2012  . Right foot surgery    . Laproscopic exam for fertility      SOCIAL HISTORY: Social History   Social History  . Marital Status: Married    Spouse Name: N/A  . Number of Children: N/A  . Years of Education: N/A   Occupational History  . Not on file.   Social History Main Topics  . Smoking status: Never Smoker   .  Smokeless tobacco: Not on file  . Alcohol Use: No  . Drug Use: No  . Sexual Activity: Not on file   Other Topics Concern  . Not on file   Social History Narrative    FAMILY HISTORY: Family History  Problem Relation Age of Onset  . Breast cancer Mother 57  . Diverticulosis Father   . Breast cancer Sister 11    died at 31  . Thyroid cancer Maternal Grandmother     dx 35-40 years  . Breast cancer Maternal Grandmother 61  . Brain cancer Maternal Grandfather 13    glioblastoma  . Breast cancer Other     MGMs mother dx >50    ALLERGIES:  is allergic to sulfa antibiotics.  MEDICATIONS:  Current Outpatient Prescriptions  Medication Sig Dispense Refill  . buPROPion (WELLBUTRIN SR) 100 MG 12 hr tablet Take 100 mg by mouth 2 (two) times daily.    . Cholecalciferol (VITAMIN D) 2000 units CAPS Take 1 capsule by mouth daily.    . clonazePAM (KLONOPIN) 0.5 MG tablet Take 0.5 mg by mouth as needed.  0  . folic acid (FOLVITE) 1 MG tablet Take 1 mg by mouth daily.    . mercaptopurine (PURINETHOL) 50 MG tablet Take 25 mg by mouth daily. Give on an empty stomach 1 hour before or 2 hours after meals. Caution: Chemotherapy.    . propranolol (INDERAL) 10 MG tablet 1/2 tablet as needed for palpitations 30 tablet 1   No current facility-administered medications  for this visit.    REVIEW OF SYSTEMS:   Constitutional: Denies fevers, chills or abnormal night sweats Eyes: Denies blurriness of vision, double vision or watery eyes Ears, nose, mouth, throat, and face: Denies mucositis or sore throat Respiratory: Denies cough, dyspnea or wheezes Cardiovascular: Denies palpitation, chest discomfort or lower extremity swelling Gastrointestinal:  Denies nausea, heartburn or change in bowel habits Skin: Denies abnormal skin rashes Lymphatics: Denies new lymphadenopathy or easy bruising Neurological:Denies numbness, tingling or new weaknesses Behavioral/Psych: Mood is stable, no new changes  All other  systems were reviewed with the patient and are negative.   PHYSICAL EXAMINATION: ECOG PERFORMANCE STATUS: 0 - Asymptomatic  Filed Vitals:   06/11/15 1533  BP: 120/81  Pulse: 90  Temp: 98.7 F (37.1 C)  Resp: 18   Filed Weights   06/11/15 1533  Weight: 178 lb 3.2 oz (80.831 kg)    GENERAL:alert, no distress and comfortable SKIN: skin color, texture, turgor are normal, no rashes or significant lesions EYES: normal, conjunctiva are pink and non-injected, sclera clear OROPHARYNX:no exudate, no erythema and lips, buccal mucosa, and tongue normal  NECK: supple, thyroid normal size, non-tender, without nodularity LYMPH:  no palpable lymphadenopathy in the cervical, axillary or inguinal LUNGS: clear to auscultation and percussion with normal breathing effort HEART: regular rate & rhythm and no murmurs and no lower extremity edema ABDOMEN:abdomen soft, non-tender and normal bowel sounds Musculoskeletal:no cyanosis of digits and no clubbing  PSYCH: alert & oriented x 3 with fluent speech NEURO: no focal motor/sensory deficits Breasts: Breast inspection showed them to be symmetrical with no nipple discharge. There are 1-2cm size mass in the IUQ and OLQ right breast. Palpation of the left breast and axilla revealed no obvious mass that I could appreciate.   LABORATORY DATA:  I have reviewed the data as listed No results found for: WBC, HGB, HCT, MCV, PLT No results for input(s): NA, K, CL, CO2, GLUCOSE, BUN, CREATININE, CALCIUM, GFRNONAA, GFRAA, PROT, ALBUMIN, AST, ALT, ALKPHOS, BILITOT, BILIDIR, IBILI in the last 8760 hours.  PATHOLOGY REPORT Diagnosis 06/08/2014 1. Breast, right, needle core biopsy, UIQ - DUCTAL CARCINOMA IN SITU, SEE COMMENT. 2. Breast, right, needle core biopsy, LOQ - DUCTAL CARCINOMA IN SITU, SEE COMMENT. Microscopic Comment 1. Immunohistochemical stains for cytokeratin 5/6 and estrogen receptor are performed after review of the H&E stained section in the first  specimen (right upper inner quadrant biopsy). The findings are consistent with the above diagnosis. Although definitive grading is best performed on excision specimen, the ductal carcinoma in situ appears low grade, as sampled. A quantitative estrogen receptor and progesterone receptor will be performed and reported in an addendum to follow. Dr. Lyndon Code as seen the first specimen is consultation with agreement. The findings are called to the [facility] on 06/10/15. 2. Immunohistochemical stains for cytokeratin 5/6 and estrogen receptor are performed after review of the H&E stained section in the second specimen (right lower outer quadrant biopsy). The findings are consistent with the above diagnosis. Although definitive grading is best performed on excision specimen, the ductal carcinoma in situ appears low grade, as sampled. A quantitative estrogen receptor and progesterone receptor will be performed and reported in an addendum to follow. Dr. Lyndon Code as seen the first specimen is consultation with agreement. The findings are called to the [facility] on 06/10/15. (RH:ds 06/10/15)  RADIOGRAPHIC STUDIES: I have personally reviewed the radiological images as listed and agreed with the findings in the report. Mr Breast Bilateral W  Wo Contrast  05/24/2015  CLINICAL DATA:  Family history breast cancer in mother at age 82 and sister at age 14. The patient has also complained of diffuse right breast pain in the past several months. Recent diagnostic mammography and ultrasound failed to demonstrate an abnormality. LABS:  Not applicable EXAM: BILATERAL BREAST MRI WITH AND WITHOUT CONTRAST TECHNIQUE: Multiplanar, multisequence MR images of both breasts were obtained prior to and following the intravenous administration of 17 ml of MultiHance THREE-DIMENSIONAL MR IMAGE RENDERING ON INDEPENDENT WORKSTATION: Three-dimensional MR images were rendered by post-processing of the original MR data  on an independent workstation. The three-dimensional MR images were interpreted, and findings are reported in the following complete MRI report for this study. Three dimensional images were evaluated at the independent DynaCad workstation COMPARISON:  08/25/2013 breast MRI, 01/31/2013 breast MRI. Mammograms dated 05/14/2015, 04/08/2015, 08/25/2014, 07/25/2013, 06/11/2012. FINDINGS: Breast composition: c.  Heterogeneous fibroglandular tissue. Background parenchymal enhancement: Moderate. Right breast: The patient has developed clumped linear/nodular non mass enhancement within the upper inner quadrant of the right breast posterior depth which spans 2.4 x 2.0 x 2.1 cm. There is also a second area of non mass enhancement located laterally within the right breast at the 8:30- 9 o'clock position posterior depth measuring 2.5 x 2.0 x 1.4 cm in size. There is an irregular enhancing mass located within the right breast at the 3:30 o'clock position (middle depth) measuring 7 x 7 x 8 mm in size and this is associated with a combination of plateau and washout enhancement kinetics. These areas of enhancement all represent a change when compared with prior studies. MR guided biopsy of the area of clumped linear/nodular non mass enhancement located within the upper inner quadrant of the right breast (posterior depth) measuring 2.0 x 2.1 x 2.4 cm in size is recommended. Also, MR guided biopsy of the 7 x 7 x 8 mm enhancing mass located within the right breast at the 3:30 o'clock position is recommended. Depending on the results of these MR guided biopsies, additional MR guided biopsy of the area of non mass enhancement located within the right breast at the 8:30- 9 o'clock position may be indicated. Left breast: No mass or abnormal enhancement. Lymph nodes: No abnormal appearing lymph nodes. Ancillary findings:  None. IMPRESSION: 1. Areas of non mass enhancement located within the upper inner quadrant of the right breast and  lateral right breast at the 8:30- 9 o'clock position as discussed above. 2. 8 mm irregular enhancing mass located within the right breast at the 3:30 o'clock position. MR guided biopsies of the 8 mm mass located at the 3:30 o'clock position and the clumped nodular non mass enhancement located within the upper inner quadrant of the right breast posterior depth are recommended. Depending on the results of these biopsies, a third MR guided biopsy of the non mass enhancement located laterally within the right breast at the 8:30 9 o'clock position may be indicated. RECOMMENDATION: Right breast MR guided biopsies as discussed above. BI-RADS CATEGORY  4: Suspicious. Electronically Signed   By: Rolla Plate M.D.   On: 05/24/2015 13:48   Mm Digital Diagnostic Unilat R  06/09/2015  CLINICAL DATA:  Status post MRI-guided biopsy of 2 areas of non mass enhancement within the right breast. EXAM: DIAGNOSTIC RIGHT MAMMOGRAM POST MRI BIOPSY COMPARISON:  Previous exam(s). FINDINGS: Mammographic images were obtained following MRI guided biopsy of 2 areas of non mass enhancement within the right breast. First area of non mass enhancement that was biopsied  is located in the upper inner quadrant at posterior depth. Second area of non mass enhancement was biopsied is located within the lower outer quadrant at middle depth. At the conclusion of each biopsy, a tissue marker was deployed into each biopsy cavity. Both biopsy clip appear reasonably well positioned comparing to the locations of the non mass enhancement on earlier MRI. IMPRESSION: Postprocedure mammogram for clip placement. Two biopsy clips appear adequately positioned in the right breast, corresponding to the 2 areas of non mass enhancement described on recent screening breast MRI, upper inner quadrant and lower outer quadrant. Final Assessment: Post Procedure Mammograms for Marker Placement Electronically Signed   By: Franki Cabot M.D.   On: 06/09/2015 11:49   US  Breast Ltd Uni Right Inc Axilla  06/09/2015  CLINICAL DATA:  Two areas of suspicious non mass enhancement identified within the right breast on recent screening mammogram. These were biopsied earlier today using MRI guidance. Additional suspicious enhancing mass identified within the medial right breast, 3:30 o'clock axis region, on the screening MRI. Ultrasound is now being performed to evaluate for possible sonographic correlate. EXAM: ULTRASOUND OF THE RIGHT BREAST COMPARISON:  Previous exam(s). FINDINGS: On physical exam, there is vague soft tissue thickening within the inner right breast without evidence of circumscribed mass. Targeted ultrasound is performed, showing only normal fibroglandular tissues and fat lobules throughout the inner right breast. No sonographic correlate for the enhancing mass identified on earlier screening breast MRI. IMPRESSION: No sonographic correlate for the enhancing mass identified on earlier screening breast MRI. Patient is scheduled for MRI-guided biopsy of this mass on Friday. RECOMMENDATION: MRI-guided biopsy of the enhancing mass, medial right breast, identified on earlier screening breast MRI. Pathology results pending for MRI-guided biopsies, 2 areas of suspicious non-mass enhancement within the right breast, performed earlier today. I have discussed the findings and recommendations with the patient. Results were also provided in writing at the conclusion of the visit. If applicable, a reminder letter will be sent to the patient regarding the next appointment. BI-RADS CATEGORY  1: Negative. No sonographic abnormality identified. MRI-guided biopsy is scheduled for Friday. Electronically Signed   By: Franki Cabot M.D.   On: 06/09/2015 11:55   Mm Diag Breast Tomo Bilateral  05/14/2015  CLINICAL DATA:  Diffuse right breast pain.  Pre MRI evaluation. EXAM: DIGITAL DIAGNOSTIC BILATERAL MAMMOGRAM WITH 3D TOMOSYNTHESIS AND CAD COMPARISON:  04/08/2015 and earlier ACR Breast  Density Category c: The breast tissue is heterogeneously dense, which may obscure small masses. FINDINGS: No suspicious mass, distortion, or microcalcifications are identified to suggest presence of malignancy. Mammographic images were processed with CAD. IMPRESSION: No mammographic evidence for malignancy. RECOMMENDATION: Screening mammogram in one year.(Code:[id number]-B-01Y) I have discussed the findings and recommendations with the patient. Results were also provided in writing at the conclusion of the visit. If applicable, a reminder letter will be sent to the patient regarding the next appointment. BI-RADS CATEGORY  1: Negative. Electronically Signed   By: Nolon Nations M.D.   On: 05/14/2015 15:48   Mr Rt Breast Bx Johnella Moloney Dev 1st Lesion Image Bx Spec Mr Guide  06/11/2015  ADDENDUM REPORT: 06/11/2015 08:11 ADDENDUM: The final pathological diagnosis is ductal carcinoma in situ for both sites biopsied in the right breast. This is concordant with the imaging findings. Treatment plan is recommended. The patient is seeing Dr. Donne Hazel this morning to discuss options. The final pathological diagnosis and recommendation were discussed with the patient on 06/11/2015. Her questions were answered. Biopsy of  the third suspicious area in the right breast is being deferred until the patient discusses treatment options with Dr. Donne Hazel. Electronically Signed   By: Claudie Revering M.D.   On: 06/11/2015 08:11  06/11/2015  CLINICAL DATA:  Recent screening breast MRI identifying 2 areas of suspicious non-mass enhancement within the right breast, 1 area within the upper inner quadrant at posterior depth and an additional area within the lower inner quadrant at middle depth. EXAM: MRI GUIDED CORE NEEDLE BIOPSY OF THE RIGHT BREAST, for upper inner quadrant non mass enhancement MRI GUIDED CORE NEEDLE BIOPSY OF THE RIGHT BREAST, for lower outer quadrant non mass enhancement TECHNIQUE: Multiplanar, multisequence MR imaging of the right  breast was performed both before and after administration of intravenous contrast. CONTRAST:  29m MULTIHANCE GADOBENATE DIMEGLUMINE 529 MG/ML IV SOLN COMPARISON:  Previous exams. FINDINGS: I met with the patient, and we discussed the procedure of MRI guided biopsy, including risks, benefits, and alternatives. Specifically, we discussed the risks of infection, bleeding, tissue injury, clip migration, and inadequate sampling. Informed, written consent was given. The usual time out protocol was performed immediately prior to the procedure. Using sterile technique, 2% Lidocaine, MRI guidance, and a 9 gauge vacuum assisted device, biopsy was performed of the non mass enhancement within the upper inner quadrant, at posterior depth, using a lateral approach. At the conclusion of the procedure, a rod-shaped tissue marker clip was deployed into the biopsy cavity. Next, using sterile technique, 2% lidocaine, MRI guidance, and a 9 gauge vacuum assisted device, biopsy was performed of the non mass enhancement within the lower outer quadrant, at middle depth, using a lateral approach. At the conclusion of the procedure, a dumbbell-shaped tissue marker was deployed into the biopsy cavity. Follow-up 2-view mammogram was performed and dictated separately. IMPRESSION: MRI guided biopsy of 2 areas of non-mass enhancement within the right breast, upper inner quadrant and lower outer quadrant. No apparent complications. Electronically Signed: By: SFranki CabotM.D. On: 06/09/2015 11:46   Mr Rt Breast Bx W Loc Dev Ea Add Lesion Image Bx Spec Mr Guide  06/11/2015  ADDENDUM REPORT: 06/11/2015 08:11 ADDENDUM: The final pathological diagnosis is ductal carcinoma in situ for both sites biopsied in the right breast. This is concordant with the imaging findings. Treatment plan is recommended. The patient is seeing Dr. WDonne Hazelthis morning to discuss options. The final pathological diagnosis and recommendation were discussed with the  patient on 06/11/2015. Her questions were answered. Biopsy of the third suspicious area in the right breast is being deferred until the patient discusses treatment options with Dr. WDonne Hazel Electronically Signed   By: SClaudie ReveringM.D.   On: 06/11/2015 08:11  06/11/2015  CLINICAL DATA:  Recent screening breast MRI identifying 2 areas of suspicious non-mass enhancement within the right breast, 1 area within the upper inner quadrant at posterior depth and an additional area within the lower inner quadrant at middle depth. EXAM: MRI GUIDED CORE NEEDLE BIOPSY OF THE RIGHT BREAST, for upper inner quadrant non mass enhancement MRI GUIDED CORE NEEDLE BIOPSY OF THE RIGHT BREAST, for lower outer quadrant non mass enhancement TECHNIQUE: Multiplanar, multisequence MR imaging of the right breast was performed both before and after administration of intravenous contrast. CONTRAST:  143mMULTIHANCE GADOBENATE DIMEGLUMINE 529 MG/ML IV SOLN COMPARISON:  Previous exams. FINDINGS: I met with the patient, and we discussed the procedure of MRI guided biopsy, including risks, benefits, and alternatives. Specifically, we discussed the risks of infection, bleeding, tissue injury, clip migration, and  inadequate sampling. Informed, written consent was given. The usual time out protocol was performed immediately prior to the procedure. Using sterile technique, 2% Lidocaine, MRI guidance, and a 9 gauge vacuum assisted device, biopsy was performed of the non mass enhancement within the upper inner quadrant, at posterior depth, using a lateral approach. At the conclusion of the procedure, a rod-shaped tissue marker clip was deployed into the biopsy cavity. Next, using sterile technique, 2% lidocaine, MRI guidance, and a 9 gauge vacuum assisted device, biopsy was performed of the non mass enhancement within the lower outer quadrant, at middle depth, using a lateral approach. At the conclusion of the procedure, a dumbbell-shaped tissue marker  was deployed into the biopsy cavity. Follow-up 2-view mammogram was performed and dictated separately. IMPRESSION: MRI guided biopsy of 2 areas of non-mass enhancement within the right breast, upper inner quadrant and lower outer quadrant. No apparent complications. Electronically Signed: By: Franki Cabot M.D. On: 06/09/2015 11:46    ASSESSMENT & PLAN: 50 yo pre-menopause female   1. Right beast multifocal DCIS, low grade, ER/PR result pending  -I discussed her breast imaging and needle biopsy results with patient in great detail. -She was seen by breast surgeon Dr. Donne Hazel today, who recommends right mastectomy due to multifoci disease, she prefers bilateral mastectomy. -Her DCIS will be cured by complete surgical resection. -If she opts bilateral mastectomy, she does not need any adjuvant therapy (radiation or antiestrogen therapy) for breast cancer prevention and her risk of breast cancer is minimum after mastectomy, no routine follow up is necessary.  -We also discussed that biopsy may have sampling limitation, we will review her surgical path, to see if she has any invasive carcinoma components. -She has a strong family history of breast cancer, genetic testing was done 3 years ago which was negative. -Her ER/PR result is still pending, (I called pathology), I'll call her with the results next week. The ER/PR status will not impact on her treatment decision or further follow-up.  Plan -She will likely have bilateral mastectomy soon -I'll review her surgical pathology report. I'll see her after her surgery if the final surgical path reveals invasive disease. If negative for invasive disease, she does not need routine follow up with me.     All questions were answered. The patient knows to call the clinic with any problems, questions or concerns. I spent 35 minutes counseling the patient face to face. The total time spent in the appointment was 40 minutes and more than 50% was on  counseling.     Truitt Merle, MD 06/11/2015 3:57 PM

## 2015-06-12 DIAGNOSIS — D0511 Intraductal carcinoma in situ of right breast: Secondary | ICD-10-CM | POA: Insufficient documentation

## 2015-06-15 DIAGNOSIS — Z803 Family history of malignant neoplasm of breast: Secondary | ICD-10-CM | POA: Diagnosis not present

## 2015-06-15 DIAGNOSIS — D0511 Intraductal carcinoma in situ of right breast: Secondary | ICD-10-CM | POA: Diagnosis not present

## 2015-06-16 ENCOUNTER — Other Ambulatory Visit: Payer: Self-pay | Admitting: General Surgery

## 2015-06-16 DIAGNOSIS — D0511 Intraductal carcinoma in situ of right breast: Secondary | ICD-10-CM

## 2015-06-21 ENCOUNTER — Telehealth: Payer: Self-pay | Admitting: *Deleted

## 2015-06-21 NOTE — Telephone Encounter (Signed)
Called pt to assess needs or questions. Denies questions or concerns regarding dx or treatment care plan. Discussed survivorship program after completion of sx. Encourage pt to call with needs. Received verbal understanding. Contact information provided.

## 2015-06-22 NOTE — H&P (Signed)
  Subjective:    Patient ID: Christina Logan is a 50 y.o. female.  HPI  Patient of Drs. Donne Hazel and Shelby here for consultation for breast reconstruction. Patient with strong family history breast cancer, reported right breast pain and screening MMG negative. Obtained MRI with NME within the UIQ of the right breast 2.4 x 2.0 x 2.1 cm. There was also a second area of NME laterally within the right breast at the 8:30- 9 o'clock position 2.5 x 2.0 x 1.4 cm in size. There is an irregular enhancing mass located within the right breast at the 3:30 o'clock position measuring 7 x 7 x 8 mm. Biopsy of UIQ and LOQ DCIS. Given span area, mastectomy recommended.  Genetic testing 2013 negative. Given her strong family history, plan for bilateral mastectomies.  Current 38 A/B cup, desires larger. Works at [employer]. Wt stable     Objective:   Physical Exam  Cardiovascular: Normal rate.  Pulmonary/Chest: Effort normal.  Abdominal: Soft.  Small redundant tissue without panniculus  Genitourinary: No breast discharge.  Genitourinary Comments: pseudoptois bilat, no masses  SN to nipple R 23 L 23 cm BW R 14 L 14 cm Nipple to IMF R 8 L 7 cm  Lymphadenopathy:  She has no axillary adenopathy.  Skin:  Fitzpatrick 2       Assessment:     DCIS right breast Family history breast cancer    Plan:     Reviewed SSM vs NSM, both will be asensate and not stimulate. Discussed autologous vs implant based reconstruction. Reviewed submuscular placement expander, process expansion, second stage surgery for implants. Reviewed implant specific complications inc rupture, contracture, infection requiring removal. Reviewed flap donor sites, changes with wt gain and loss, risk donor site bulge or hernia. Reviewed OR length, hospital stay, drains for each. She could also elect for expander placement at time of mastectomy and could be converted to DIEP in future.   Reviewed examples of expander and  implants, portfolio pictures. Reviewed timing of futures surgeries would depend on adjuvant cancer treatment. Discussed we do take biopsy at time of surgery but if any concern for margins on final pathology would require excision NAC.   Plan bilateral NSM with possible acellular dermis. Use of acellular dermis in reconstruction reviewed, risk infection and need for removal.   Irene Limbo, MD [facility] Plastic & Reconstructive Surgery [phone]

## 2015-06-23 ENCOUNTER — Encounter (HOSPITAL_BASED_OUTPATIENT_CLINIC_OR_DEPARTMENT_OTHER): Payer: Self-pay | Admitting: *Deleted

## 2015-06-25 ENCOUNTER — Encounter (HOSPITAL_BASED_OUTPATIENT_CLINIC_OR_DEPARTMENT_OTHER)
Admission: RE | Admit: 2015-06-25 | Discharge: 2015-06-25 | Disposition: A | Discharge status: Home / Self Care | Payer: 59 | Source: Ambulatory Visit | Attending: General Surgery | Admitting: General Surgery

## 2015-06-25 DIAGNOSIS — F419 Anxiety disorder, unspecified: Secondary | ICD-10-CM | POA: Diagnosis not present

## 2015-06-25 DIAGNOSIS — F329 Major depressive disorder, single episode, unspecified: Secondary | ICD-10-CM | POA: Diagnosis not present

## 2015-06-25 DIAGNOSIS — Z803 Family history of malignant neoplasm of breast: Secondary | ICD-10-CM | POA: Diagnosis not present

## 2015-06-25 DIAGNOSIS — Z4001 Encounter for prophylactic removal of breast: Secondary | ICD-10-CM | POA: Diagnosis not present

## 2015-06-25 DIAGNOSIS — K5190 Ulcerative colitis, unspecified, without complications: Secondary | ICD-10-CM | POA: Diagnosis not present

## 2015-06-25 DIAGNOSIS — D0511 Intraductal carcinoma in situ of right breast: Secondary | ICD-10-CM | POA: Diagnosis not present

## 2015-06-25 DIAGNOSIS — Z79899 Other long term (current) drug therapy: Secondary | ICD-10-CM | POA: Diagnosis not present

## 2015-06-25 LAB — BASIC METABOLIC PANEL
Anion gap: 10 (ref 5–15)
BUN: 7 mg/dL (ref 6–20)
CALCIUM: 9.2 mg/dL (ref 8.9–10.3)
CO2: 26 mmol/L (ref 22–32)
CREATININE: 0.92 mg/dL (ref 0.44–1.00)
Chloride: 106 mmol/L (ref 101–111)
GFR calc non Af Amer: >60 mL/min (ref >60)
Glucose, Bld: 96 mg/dL (ref 65–99)
Potassium: 4.7 mmol/L (ref 3.5–5.1)
SODIUM: 142 mmol/L (ref 135–145)

## 2015-06-25 LAB — CBC WITH DIFFERENTIAL/PLATELET
BASOS PCT: 1 %
Basophils Absolute: 0.1 10*3/uL (ref 0.0–0.1)
EOS ABS: 0.3 10*3/uL (ref 0.0–0.7)
EOS PCT: 5 %
HCT: 40.1 % (ref 36.0–46.0)
HEMOGLOBIN: 13.6 g/dL (ref 12.0–15.0)
LYMPHS ABS: 1.1 10*3/uL (ref 0.7–4.0)
Lymphocytes Relative: 20 %
MCH: 31.4 pg (ref 26.0–34.0)
MCHC: 33.9 g/dL (ref 30.0–36.0)
MCV: 92.6 fL (ref 78.0–100.0)
MONOS PCT: 10 %
Monocytes Absolute: 0.5 10*3/uL (ref 0.1–1.0)
NEUTROS PCT: 64 %
Neutro Abs: 3.4 10*3/uL (ref 1.7–7.7)
PLATELETS: 424 10*3/uL — AB (ref 150–400)
RBC: 4.33 MIL/uL (ref 3.87–5.11)
RDW: 12.3 % (ref 11.5–15.5)
WBC: 5.2 10*3/uL (ref 4.0–10.5)

## 2015-06-29 ENCOUNTER — Encounter (HOSPITAL_COMMUNITY)
Admission: RE | Admit: 2015-06-29 | Discharge: 2015-06-29 | Disposition: A | Discharge status: Home / Self Care | Payer: 59 | Source: Ambulatory Visit | Attending: General Surgery | Admitting: General Surgery

## 2015-06-29 ENCOUNTER — Ambulatory Visit (HOSPITAL_BASED_OUTPATIENT_CLINIC_OR_DEPARTMENT_OTHER)
Admission: RE | Admit: 2015-06-29 | Discharge: 2015-06-30 | Disposition: A | Discharge status: Home / Self Care | Payer: 59 | Source: Ambulatory Visit | Attending: General Surgery | Admitting: General Surgery

## 2015-06-29 ENCOUNTER — Ambulatory Visit (HOSPITAL_BASED_OUTPATIENT_CLINIC_OR_DEPARTMENT_OTHER): Payer: 59 | Admitting: Anesthesiology

## 2015-06-29 ENCOUNTER — Encounter (HOSPITAL_BASED_OUTPATIENT_CLINIC_OR_DEPARTMENT_OTHER): Payer: Self-pay

## 2015-06-29 ENCOUNTER — Encounter (HOSPITAL_BASED_OUTPATIENT_CLINIC_OR_DEPARTMENT_OTHER)
Admission: RE | Disposition: A | Discharge status: Home / Self Care | Payer: Self-pay | Source: Ambulatory Visit | Attending: General Surgery

## 2015-06-29 DIAGNOSIS — G8918 Other acute postprocedural pain: Secondary | ICD-10-CM | POA: Diagnosis not present

## 2015-06-29 DIAGNOSIS — F419 Anxiety disorder, unspecified: Secondary | ICD-10-CM | POA: Insufficient documentation

## 2015-06-29 DIAGNOSIS — Z4001 Encounter for prophylactic removal of breast: Secondary | ICD-10-CM | POA: Diagnosis not present

## 2015-06-29 DIAGNOSIS — Z79899 Other long term (current) drug therapy: Secondary | ICD-10-CM | POA: Diagnosis not present

## 2015-06-29 DIAGNOSIS — D0511 Intraductal carcinoma in situ of right breast: Secondary | ICD-10-CM | POA: Diagnosis not present

## 2015-06-29 DIAGNOSIS — Z803 Family history of malignant neoplasm of breast: Secondary | ICD-10-CM | POA: Diagnosis not present

## 2015-06-29 DIAGNOSIS — R079 Chest pain, unspecified: Secondary | ICD-10-CM | POA: Diagnosis not present

## 2015-06-29 DIAGNOSIS — C50911 Malignant neoplasm of unspecified site of right female breast: Secondary | ICD-10-CM | POA: Diagnosis not present

## 2015-06-29 DIAGNOSIS — F329 Major depressive disorder, single episode, unspecified: Secondary | ICD-10-CM | POA: Insufficient documentation

## 2015-06-29 DIAGNOSIS — K5190 Ulcerative colitis, unspecified, without complications: Secondary | ICD-10-CM | POA: Diagnosis not present

## 2015-06-29 DIAGNOSIS — Z853 Personal history of malignant neoplasm of breast: Secondary | ICD-10-CM | POA: Diagnosis not present

## 2015-06-29 DIAGNOSIS — Z9010 Acquired absence of unspecified breast and nipple: Secondary | ICD-10-CM

## 2015-06-29 HISTORY — PX: BREAST RECONSTRUCTION WITH PLACEMENT OF TISSUE EXPANDER AND FLEX HD (ACELLULAR HYDRATED DERMIS): SHX6295

## 2015-06-29 HISTORY — DX: Depression, unspecified: F32.A

## 2015-06-29 HISTORY — DX: Cardiac arrhythmia, unspecified: I49.9

## 2015-06-29 HISTORY — PX: NIPPLE SPARING MASTECTOMY/SENTINAL LYMPH NODE BIOPSY/RECONSTRUCTION/PLACEMENT OF TISSUE EXPANDER: SHX6484

## 2015-06-29 HISTORY — DX: Major depressive disorder, single episode, unspecified: F32.9

## 2015-06-29 HISTORY — DX: Anxiety disorder, unspecified: F41.9

## 2015-06-29 SURGERY — NIPPLE SPARING MASTECTOMY WITH SENTINAL LYMPH NODE BIOPSY AND  RECONSTRUCTION WITH PLACEMENT OF TISSUE EXPANDER
Anesthesia: Regional | Site: Breast | Laterality: Bilateral

## 2015-06-29 MED ORDER — DEXAMETHASONE SODIUM PHOSPHATE 10 MG/ML IJ SOLN
INTRAMUSCULAR | Status: AC
  Filled 2015-06-29: qty 1

## 2015-06-29 MED ORDER — EPHEDRINE SULFATE 50 MG/ML IJ SOLN
INTRAMUSCULAR | Status: AC
  Filled 2015-06-29: qty 1

## 2015-06-29 MED ORDER — TRAMADOL HCL 50 MG PO TABS
50.0000 mg | ORAL_TABLET | Freq: Four times a day (QID) | ORAL | Status: DC | PRN
  Administered 2015-06-29 – 2015-06-30 (×2): 50 mg via ORAL
  Filled 2015-06-29 (×2): qty 1

## 2015-06-29 MED ORDER — TECHNETIUM TC 99M SULFUR COLLOID FILTERED
1.0000 | Freq: Once | INTRAVENOUS | Status: AC | PRN
  Administered 2015-06-29: 1 via INTRADERMAL

## 2015-06-29 MED ORDER — CEFAZOLIN SODIUM-DEXTROSE 2-3 GM-% IV SOLR
INTRAVENOUS | Status: AC
  Filled 2015-06-29: qty 50

## 2015-06-29 MED ORDER — PHENYLEPHRINE 40 MCG/ML (10ML) SYRINGE FOR IV PUSH (FOR BLOOD PRESSURE SUPPORT)
PREFILLED_SYRINGE | INTRAVENOUS | Status: AC
  Filled 2015-06-29: qty 10

## 2015-06-29 MED ORDER — CLONAZEPAM 0.5 MG PO TABS: 0.5000 mg | ORAL_TABLET | Freq: Three times a day (TID) | ORAL | Status: DC | PRN

## 2015-06-29 MED ORDER — SUFENTANIL CITRATE 50 MCG/ML IV SOLN
INTRAVENOUS | Status: AC
  Filled 2015-06-29: qty 1

## 2015-06-29 MED ORDER — BUPIVACAINE-EPINEPHRINE (PF) 0.5% -1:200000 IJ SOLN
INTRAMUSCULAR | Status: DC | PRN
  Administered 2015-06-29: 12.5 mL via PERINEURAL
  Administered 2015-06-29: 12.5 mL

## 2015-06-29 MED ORDER — DEXAMETHASONE SODIUM PHOSPHATE 4 MG/ML IJ SOLN
INTRAMUSCULAR | Status: DC | PRN
  Administered 2015-06-29: 10 mg via INTRAVENOUS

## 2015-06-29 MED ORDER — SIMETHICONE 80 MG PO CHEW: 40.0000 mg | CHEWABLE_TABLET | Freq: Four times a day (QID) | ORAL | Status: DC | PRN

## 2015-06-29 MED ORDER — SCOPOLAMINE 1 MG/3DAYS TD PT72: 1.0000 | MEDICATED_PATCH | Freq: Once | TRANSDERMAL | Status: DC

## 2015-06-29 MED ORDER — PROPOFOL 10 MG/ML IV BOLUS
INTRAVENOUS | Status: DC | PRN
  Administered 2015-06-29: 200 mg via INTRAVENOUS

## 2015-06-29 MED ORDER — MIDAZOLAM HCL 2 MG/2ML IJ SOLN
INTRAMUSCULAR | Status: AC
  Filled 2015-06-29: qty 2

## 2015-06-29 MED ORDER — ATROPINE SULFATE 0.4 MG/ML IJ SOLN
INTRAMUSCULAR | Status: AC
  Filled 2015-06-29: qty 1

## 2015-06-29 MED ORDER — GLYCOPYRROLATE 0.2 MG/ML IJ SOLN
0.2000 mg | Freq: Once | INTRAMUSCULAR | Status: AC | PRN
  Administered 2015-06-29: 0.3 mg via INTRAVENOUS

## 2015-06-29 MED ORDER — ACETAMINOPHEN 650 MG RE SUPP: 650.0000 mg | Freq: Four times a day (QID) | RECTAL | Status: DC | PRN

## 2015-06-29 MED ORDER — FENTANYL CITRATE (PF) 100 MCG/2ML IJ SOLN
INTRAMUSCULAR | Status: AC
  Filled 2015-06-29: qty 2

## 2015-06-29 MED ORDER — ONDANSETRON HCL 4 MG/2ML IJ SOLN
INTRAMUSCULAR | Status: AC
Start: 2015-06-29 — End: 2015-06-29
  Filled 2015-06-29: qty 2

## 2015-06-29 MED ORDER — LACTATED RINGERS IV SOLN
INTRAVENOUS | Status: DC
  Administered 2015-06-29 (×2): via INTRAVENOUS
  Administered 2015-06-29: 10 mL/h via INTRAVENOUS

## 2015-06-29 MED ORDER — SODIUM CHLORIDE 0.9 % IV SOLN
INTRAVENOUS | Status: DC | PRN
  Administered 2015-06-29: 1000 mL

## 2015-06-29 MED ORDER — ONDANSETRON HCL 4 MG/2ML IJ SOLN
INTRAMUSCULAR | Status: DC | PRN
  Administered 2015-06-29: 4 mg via INTRAVENOUS

## 2015-06-29 MED ORDER — SUFENTANIL CITRATE 50 MCG/ML IV SOLN
INTRAVENOUS | Status: DC | PRN
  Administered 2015-06-29 (×4): 10 ug via INTRAVENOUS

## 2015-06-29 MED ORDER — MORPHINE SULFATE (PF) 2 MG/ML IV SOLN
2.0000 mg | INTRAVENOUS | Status: DC | PRN
  Administered 2015-06-29 – 2015-06-30 (×4): 2 mg via INTRAVENOUS
  Filled 2015-06-29 (×4): qty 1

## 2015-06-29 MED ORDER — ACETAMINOPHEN 325 MG PO TABS: 650.0000 mg | ORAL_TABLET | Freq: Four times a day (QID) | ORAL | Status: DC | PRN

## 2015-06-29 MED ORDER — PROMETHAZINE HCL 25 MG/ML IJ SOLN
INTRAMUSCULAR | Status: AC
  Filled 2015-06-29: qty 1

## 2015-06-29 MED ORDER — METHOCARBAMOL 500 MG PO TABS
500.0000 mg | ORAL_TABLET | Freq: Four times a day (QID) | ORAL | Status: DC | PRN
  Administered 2015-06-29 – 2015-06-30 (×2): 500 mg via ORAL
  Filled 2015-06-29 (×2): qty 1

## 2015-06-29 MED ORDER — PROMETHAZINE HCL 25 MG/ML IJ SOLN
6.2500 mg | INTRAMUSCULAR | Status: DC | PRN
  Administered 2015-06-29: 6.25 mg via INTRAVENOUS

## 2015-06-29 MED ORDER — BUPIVACAINE HCL (PF) 0.25 % IJ SOLN
INTRAMUSCULAR | Status: AC
  Filled 2015-06-29: qty 30

## 2015-06-29 MED ORDER — LIDOCAINE HCL (CARDIAC) 20 MG/ML IV SOLN
INTRAVENOUS | Status: AC
  Filled 2015-06-29: qty 5

## 2015-06-29 MED ORDER — ROCURONIUM BROMIDE 100 MG/10ML IV SOLN
INTRAVENOUS | Status: DC | PRN
  Administered 2015-06-29 (×2): 20 mg via INTRAVENOUS

## 2015-06-29 MED ORDER — CLINDAMYCIN HCL 300 MG PO CAPS: 300.0000 mg | ORAL_CAPSULE | Freq: Three times a day (TID) | ORAL | Status: DC

## 2015-06-29 MED ORDER — OXYCODONE HCL 5 MG PO TABS: 5.0000 mg | ORAL_TABLET | ORAL | Status: DC | PRN

## 2015-06-29 MED ORDER — LIDOCAINE HCL (CARDIAC) 20 MG/ML IV SOLN
INTRAVENOUS | Status: DC | PRN
  Administered 2015-06-29: 50 mg via INTRAVENOUS

## 2015-06-29 MED ORDER — SODIUM CHLORIDE 0.9 % IJ SOLN
INTRAMUSCULAR | Status: AC
  Filled 2015-06-29: qty 10

## 2015-06-29 MED ORDER — METHOCARBAMOL 500 MG PO TABS: 500.0000 mg | ORAL_TABLET | Freq: Three times a day (TID) | ORAL | Status: DC | PRN

## 2015-06-29 MED ORDER — CEFAZOLIN SODIUM-DEXTROSE 2-3 GM-% IV SOLR
2.0000 g | INTRAVENOUS | Status: AC
  Administered 2015-06-29: 2 g via INTRAVENOUS

## 2015-06-29 MED ORDER — ONDANSETRON 4 MG PO TBDP: 4.0000 mg | ORAL_TABLET | Freq: Four times a day (QID) | ORAL | Status: DC | PRN

## 2015-06-29 MED ORDER — NEOSTIGMINE METHYLSULFATE 10 MG/10ML IV SOLN
INTRAVENOUS | Status: DC | PRN
  Administered 2015-06-29: 2 mg via INTRAVENOUS

## 2015-06-29 MED ORDER — FENTANYL CITRATE (PF) 100 MCG/2ML IJ SOLN
25.0000 ug | INTRAMUSCULAR | Status: DC | PRN
  Administered 2015-06-29 (×3): 50 ug via INTRAVENOUS

## 2015-06-29 MED ORDER — SUCCINYLCHOLINE CHLORIDE 20 MG/ML IJ SOLN
INTRAMUSCULAR | Status: AC
  Filled 2015-06-29: qty 1

## 2015-06-29 MED ORDER — METHYLENE BLUE 1 % INJ SOLN
INTRAMUSCULAR | Status: AC
  Filled 2015-06-29: qty 10

## 2015-06-29 MED ORDER — HYDROCODONE-ACETAMINOPHEN 7.5-325 MG PO TABS: 1.0000 | ORAL_TABLET | Freq: Once | ORAL | Status: DC | PRN

## 2015-06-29 MED ORDER — CEFAZOLIN SODIUM 1-5 GM-% IV SOLN
INTRAVENOUS | Status: AC
  Filled 2015-06-29: qty 50

## 2015-06-29 MED ORDER — SODIUM CHLORIDE 0.9 % IV SOLN
INTRAVENOUS | Status: DC
  Administered 2015-06-29: 17:00:00 via INTRAVENOUS

## 2015-06-29 MED ORDER — PROPOFOL 500 MG/50ML IV EMUL
INTRAVENOUS | Status: AC
  Filled 2015-06-29: qty 50

## 2015-06-29 MED ORDER — SUCCINYLCHOLINE CHLORIDE 20 MG/ML IJ SOLN
INTRAMUSCULAR | Status: DC | PRN
  Administered 2015-06-29: 100 mg via INTRAVENOUS

## 2015-06-29 MED ORDER — ONDANSETRON HCL 4 MG/2ML IJ SOLN: 4.0000 mg | Freq: Four times a day (QID) | INTRAMUSCULAR | Status: DC | PRN

## 2015-06-29 MED ORDER — FENTANYL CITRATE (PF) 100 MCG/2ML IJ SOLN
50.0000 ug | INTRAMUSCULAR | Status: AC | PRN
  Administered 2015-06-29: 25 ug via INTRAVENOUS
  Administered 2015-06-29 (×2): 50 ug via INTRAVENOUS

## 2015-06-29 MED ORDER — ROPIVACAINE HCL 5 MG/ML IJ SOLN
INTRAMUSCULAR | Status: DC | PRN
  Administered 2015-06-29 (×2): 12.5 mL via PERINEURAL

## 2015-06-29 MED ORDER — CEFAZOLIN SODIUM 1-5 GM-% IV SOLN
1.0000 g | Freq: Three times a day (TID) | INTRAVENOUS | Status: AC
Start: 2015-06-29 — End: 2015-06-29
  Administered 2015-06-29 (×2): 1 g via INTRAVENOUS
  Filled 2015-06-29 (×2): qty 50

## 2015-06-29 MED ORDER — MIDAZOLAM HCL 2 MG/2ML IJ SOLN
1.0000 mg | INTRAMUSCULAR | Status: DC | PRN
  Administered 2015-06-29: 2 mg via INTRAVENOUS
  Administered 2015-06-29: 0.25 mg via INTRAVENOUS
  Administered 2015-06-29: 2 mg via INTRAVENOUS

## 2015-06-29 SURGICAL SUPPLY — 75 items
APPLIER CLIP 11 MED OPEN (CLIP) ×3
APR CLP MED 11 20 MLT OPN (CLIP) ×1
BAG DECANTER FOR FLEXI CONT (MISCELLANEOUS) ×3 IMPLANT
BINDER BREAST LRG (GAUZE/BANDAGES/DRESSINGS) IMPLANT
BINDER BREAST XLRG (GAUZE/BANDAGES/DRESSINGS) IMPLANT
BLADE HEX COATED 2.75 (ELECTRODE) ×5 IMPLANT
BLADE SURG 10 STRL SS (BLADE) ×5 IMPLANT
BLADE SURG 15 STRL LF DISP TIS (BLADE) ×1 IMPLANT
BLADE SURG 15 STRL SS (BLADE) ×6
BNDG GAUZE ELAST 4 BULKY (GAUZE/BANDAGES/DRESSINGS) ×6 IMPLANT
CANISTER SUCT 1200ML W/VALVE (MISCELLANEOUS) ×3 IMPLANT
CHLORAPREP W/TINT 26ML (MISCELLANEOUS) ×5 IMPLANT
CLIP APPLIE 11 MED OPEN (CLIP) ×1 IMPLANT
CLOSURE WOUND 1/2 X4 (GAUZE/BANDAGES/DRESSINGS)
COVER BACK TABLE 60X90IN (DRAPES) ×3 IMPLANT
COVER MAYO STAND STRL (DRAPES) ×5 IMPLANT
COVER PROBE W GEL 5X96 (DRAPES) ×3 IMPLANT
DRAIN CHANNEL 19F RND (DRAIN) ×4 IMPLANT
DRAPE U-SHAPE 76X120 STRL (DRAPES) ×4 IMPLANT
DRAPE UTILITY XL STRL (DRAPES) ×3 IMPLANT
DRSG PAD ABDOMINAL 8X10 ST (GAUZE/BANDAGES/DRESSINGS) ×7 IMPLANT
DRSG TEGADERM 4X10 (GAUZE/BANDAGES/DRESSINGS) ×4 IMPLANT
DRSG TEGADERM 4X4.75 (GAUZE/BANDAGES/DRESSINGS) ×4 IMPLANT
ELECT BLADE 6.5 .24CM SHAFT (ELECTRODE) ×2 IMPLANT
ELECT REM PT RETURN 9FT ADLT (ELECTROSURGICAL) ×3
ELECTRODE REM PT RTRN 9FT ADLT (ELECTROSURGICAL) ×1 IMPLANT
EVACUATOR SILICONE 100CC (DRAIN) ×5 IMPLANT
EXPANDER TISSUE MX 300CC (Breast) IMPLANT
GLOVE BIO SURGEON STRL SZ 6 (GLOVE) ×6 IMPLANT
GLOVE BIO SURGEON STRL SZ 6.5 (GLOVE) ×4 IMPLANT
GLOVE BIO SURGEON STRL SZ7 (GLOVE) ×5 IMPLANT
GLOVE BIO SURGEONS STRL SZ 6.5 (GLOVE) ×4
GLOVE BIOGEL PI IND STRL 6.5 (GLOVE) IMPLANT
GLOVE BIOGEL PI IND STRL 7.0 (GLOVE) IMPLANT
GLOVE BIOGEL PI IND STRL 7.5 (GLOVE) ×1 IMPLANT
GLOVE BIOGEL PI INDICATOR 6.5 (GLOVE) ×2
GLOVE BIOGEL PI INDICATOR 7.0 (GLOVE) ×6
GLOVE BIOGEL PI INDICATOR 7.5 (GLOVE) ×4
GOWN STRL REUS W/ TWL LRG LVL3 (GOWN DISPOSABLE) ×3 IMPLANT
GOWN STRL REUS W/TWL LRG LVL3 (GOWN DISPOSABLE) ×18
GRAFT FLEX HD 4X16 THICK (Tissue Mesh) ×2 IMPLANT
IV NS 500ML (IV SOLUTION) ×3
IV NS 500ML BAXH (IV SOLUTION) IMPLANT
KIT FILL SYSTEM UNIVERSAL (SET/KITS/TRAYS/PACK) ×3 IMPLANT
LIGHT WAVEGUIDE WIDE FLAT (MISCELLANEOUS) ×2 IMPLANT
LIQUID BAND (GAUZE/BANDAGES/DRESSINGS) ×6 IMPLANT
NDL HYPO 25X1 1.5 SAFETY (NEEDLE) IMPLANT
NEEDLE HYPO 25X1 1.5 SAFETY (NEEDLE) ×6 IMPLANT
NS IRRIG 1000ML POUR BTL (IV SOLUTION) ×3 IMPLANT
PACK BASIN DAY SURGERY FS (CUSTOM PROCEDURE TRAY) ×5 IMPLANT
PACK UNIVERSAL I (CUSTOM PROCEDURE TRAY) ×2 IMPLANT
PENCIL BUTTON HOLSTER BLD 10FT (ELECTRODE) ×5 IMPLANT
PIN SAFETY STERILE (MISCELLANEOUS) ×2 IMPLANT
SHEET MEDIUM DRAPE 40X70 STRL (DRAPES) ×4 IMPLANT
SLEEVE SCD COMPRESS KNEE MED (MISCELLANEOUS) ×3 IMPLANT
SPONGE LAP 18X18 X RAY DECT (DISPOSABLE) ×14 IMPLANT
STAPLER VISISTAT 35W (STAPLE) ×3 IMPLANT
STRIP CLOSURE SKIN 1/2X4 (GAUZE/BANDAGES/DRESSINGS) IMPLANT
SUT ETHILON 2 0 FS 18 (SUTURE) ×4 IMPLANT
SUT MNCRL AB 4-0 PS2 18 (SUTURE) ×5 IMPLANT
SUT SILK 2 0 SH (SUTURE) ×4 IMPLANT
SUT VIC AB 3-0 SH 27 (SUTURE) ×15
SUT VIC AB 3-0 SH 27X BRD (SUTURE) ×1 IMPLANT
SUT VICRYL 3-0 CR8 SH (SUTURE) ×2 IMPLANT
SUT VICRYL 4-0 PS2 18IN ABS (SUTURE) ×5 IMPLANT
SYR 50ML LL SCALE MARK (SYRINGE) ×2 IMPLANT
SYR BULB IRRIGATION 50ML (SYRINGE) ×4 IMPLANT
SYR CONTROL 10ML LL (SYRINGE) ×4 IMPLANT
TISSUE EXPANDER MX 300CC (Breast) ×6 IMPLANT
TOWEL OR 17X24 6PK STRL BLUE (TOWEL DISPOSABLE) ×8 IMPLANT
TRAY FOLEY CATH SILVER 16FR (SET/KITS/TRAYS/PACK) ×2 IMPLANT
TUBE CONNECTING 20'X1/4 (TUBING) ×2
TUBE CONNECTING 20X1/4 (TUBING) ×3 IMPLANT
UNDERPAD 30X30 (UNDERPADS AND DIAPERS) ×5 IMPLANT
YANKAUER SUCT BULB TIP NO VENT (SUCTIONS) ×5 IMPLANT

## 2015-06-29 NOTE — Anesthesia Postprocedure Evaluation (Signed)
Anesthesia Post Note  Patient: Christina Logan  Procedure(s) Performed: Procedure(s) (LRB): RIGHT NIPPLE SPARING MASTECTOMY WITH SENTINAL LYMPH NODE BIOPSY AND LEFT PROPHYLACTIC NIPPLE SPARING MASTECTOMY (Bilateral) BILATERAL BREAST RECONSTRUCTION WITH PLACEMENT OF TISSUE EXPANDER AND FLEX HD (ACELLULAR HYDRATED DERMIS) (Bilateral)  Patient location during evaluation: PACU Anesthesia Type: General Level of consciousness: awake and alert Pain management: pain level controlled Vital Signs Assessment: post-procedure vital signs reviewed and stable Respiratory status: spontaneous breathing, nonlabored ventilation, respiratory function stable and patient connected to nasal cannula oxygen Cardiovascular status: blood pressure returned to baseline and stable Postop Assessment: no signs of nausea or vomiting Anesthetic complications: no    Last Vitals:  Filed Vitals:   06/29/15 1430 06/29/15 1445  BP: 112/67 117/72  Pulse: 58 57  Temp:    Resp: 9 11    Last Pain: There were no vitals filed for this visit.               Zenaida Deed

## 2015-06-29 NOTE — Op Note (Signed)
Operative Note   DATE OF OPERATION: 1.24.17  LOCATION: [facility]- observation  SURGICAL DIVISION: Plastic Surgery  PREOPERATIVE DIAGNOSES:  1. Right breast DCIS 2. Family history breast cancer 3. Dense breasts  POSTOPERATIVE DIAGNOSES:  same  PROCEDURE:  1. Bilateral breast reconstruction with tissue expanders 2. Acellular dermis (Flex HD) for breast reconstruction total 60 cm2  SURGEON: Irene Limbo MD MBA  ASSISTANT: none  ANESTHESIA:  General.   EBL: 150 ml for entire case  COMPLICATIONS: None.   INDICATIONS FOR PROCEDURE:  The patient, Christina Logan, is a 50 y.o. female born on 01/01/1966, is here for immediate breast reconstruction with tissue expanders and ADM following nipple sparing mastectomies.   FINDINGS: Right mastectomy weight 270 g Left mastectomy 241 g. Natrelle 300 ml [id number] tissue expanders placed bilaterally, initial fill volume 150 ml. RIGHT  SN [id number] LEFT SN [id number]  DESCRIPTION OF PROCEDURE:  The patient was marked with the patient in the preoperative area to mark sternal notch, chest midline, anterior axillary lines and inframammary folds. The patient was taken to the operating room. SCDs were placed and IV antibiotics were given. The patient's operative site was prepped and draped in a sterile fashion. A time out was performed and all information was confirmed to be correct. Following completion of mastectomies, reconstruction began on left side. The inferior insertions of pectoralis major muscle were elevated continuous with abdominal wall fascia. Submuscular dissection completed toward clavicle. The anterior rectus fascia was elevated 1-2 cm below inframammary fold. The serratus muscle and fascia were elevated laterally. Flex HD was perforated and sewn to inferior border of pectoralis major with running 3-0 vicryl. A 19 Fr drain was placed in subcutaneous position laterally and submuscular position medialy and secured to skin with 2-0  nylon. The cavity was irrigated with solution containing Ancef, genatmicin, and bacitracin. Hemostasis was ensured. The tissue expander was prepared and placed in submuscular position. The expander was secured to the elevated serratus muscle and fascia with 3-0 vicryl. The incision was closed with 3-0 vicryl in fascial layer and 4-0 vicryl in dermis. Skin closure completed with 4-0 monocryl subcuticular and tissue adhesive. Over right breast, similarly the inferior pectoralis insertions were elevated continuous with anterior rectus fascia. Te serratus muscle and fascia elevated to desired anterior axillary line. The acellular dermis was prepared and secured to inferior border of pectoralis with 3-0 vicryl. 19 Fr drain placed in cavity prior to placement of expander. The inferior and lateral borders of acellular dermis secured to elevated serratus. Closure completed in similar fashion. The ports were accessed and filled to 150 ml bilaterally. The patient was brought to upright position and the skin flaps were redraped so that NAC was symmetric from the sternal notch and midline. Transparent, adherent dressings applied. Dry dressing and breast binder applied.  The patient was allowed to wake from anesthesia, extubated and taken to the recovery room in satisfactory condition.   SPECIMENS: none  DRAINS: 19 Fr JP in right and left reconstructed breast  Irene Limbo, MD [facility] Plastic & Reconstructive Surgery [phone]

## 2015-06-29 NOTE — Progress Notes (Signed)
Assisted nuc med tech [phone] with nuc med inj. Side rails up, monitors on throughout procedure. See vital signs in flow sheet. Tolerated Procedure well.

## 2015-06-29 NOTE — Op Note (Signed)
Preoperative diagnosis: left breast clinical stage 0 cancer Postoperative diagnosis: same as above Procedure: 1. Right nipple sparing mastectomy 2. Left prophylactic nipple sparing mastectomy 3. Right axillary sentinel node biopsy Surgeon: Dr Serita Grammes Asst: Dr Irene Limbo Anesthesia: general with pec block bilaterally EBL: 50 cc Drains per plastic surgery Complications none Specimens:  1. Right nsm marked short superior, long lateral, double na margin 2. Right nipple margin 3. Left nsm marked same way 4. Left nipple margin 5. Right axillary sentinel nodes with highest count of 609 Disposition case turned over to plastic surgery  Indications: This is a 38 yof with significant family history and d density breasts who has two separate areas of dcis on screening mri.discussed all options.  Plan for bilateral nsm with right axillary sentinel node biopsy with immediate expander reconstruction.   Procedure: After informed consent was obtained she first underwent injection of technetium in the standard periareolar fashion. She also underwent a pectoral block. She was given antibiotics. She had SCDs in place. She was placed under general anesthesia without complication. She was then prepped and draped in the standard sterile surgical fashion. A surgical timeout was then performed.  I first performed the left nsm. I measured an 11 cm incision out 8 cm from xyphoid. This was made in the inframammary fold. I then used cautery to do the posterior plane removing the breast and the fascia from the pectoralis muscle to the parasternal region, clavicle and the latissimus. I then created the anterior flap.The breast tissue was then removed. I removed the nipple margin as a separate specimen. Hemostasis was obtained. The flaps were viable and all breast tissue had been removed. We then packed this and moved to the other side.  There was activity in the axilla from the neoprobe. I made a  similar incision in the IM fold of the right side.  I created flaps as above.. The breast tissue was removed in its entirety. I then removed the nipple margin separately. Hemostasis was obtained. I then used the neoprobe to identify the sentinel nodes with counts above. I then turned the case over to Dr Iran Planas for reconstruction

## 2015-06-29 NOTE — H&P (Signed)
71 yof who I saw recently for breast pain. she has prior genetic testing that is negative and has significant family history. she has d density breasts and has recent negative mm and [place] at area of concern. I sent her for mri given concern, density and fh. MRI showed clumped linear nodular nme within RUIQ that measures 2.4x2x2.1 cm in size. there is second area at 830 measuring 2.5x2x1.4 cm in size. there is also irregular enhancing mass at 330 measuring 8 mm. left breast and nodes all normal. she underwent biopsy of the two areas of nme that both show low grade dcis. this has been confirmed by Dr Lyndon Code. she returns today after learning pathology with her husband.  Other Problems  Diverticulosis Lump In Breast Ulcerative Colitis  Past Surgical History  Breast Biopsy Left. Colon Polyp Removal - Colonoscopy Foot Surgery Right.  Diagnostic Studies History  Colonoscopy within last year Mammogram within last year Pap Smear 1-5 years ago  Allergies Sulfa Antibiotics  Medication History  Mercaptopurine (50MG  Tablet, Oral) Active. BuPROPion HCl ER (SR) (150MG  Tablet ER 12HR, Oral) Active. Folic Acid (1MG  Tablet, Oral) Active. Medications Reconciled  Social History  Alcohol use Occasional alcohol use. Caffeine use Tea. No drug use Tobacco use Never smoker.  Family History Alcohol Abuse Sister. Breast Cancer Mother, Sister. Colon Polyps Father, Mother. Depression Sister. Hypertension Father.  Pregnancy / Birth History Age at menarche 26 years. Contraceptive History Intrauterine device, Oral contraceptives. Gravida 0 Para 0 Regular periods  Review of Systems  General Not Present- Appetite Loss, Chills, Fatigue, Fever, Night Sweats, Weight Gain and Weight Loss. Skin Not Present- Change in Wart/Mole, Dryness, Hives, Jaundice, New Lesions, Non-Healing Wounds, Rash and Ulcer. HEENT Not Present- Earache, Hearing Loss, Hoarseness, Nose Bleed, Oral  Ulcers, Ringing in the Ears, Seasonal Allergies, Sinus Pain, Sore Throat, Visual Disturbances, Wears glasses/contact lenses and Yellow Eyes. Respiratory Not Present- Bloody sputum, Chronic Cough, Difficulty Breathing, Snoring and Wheezing. Breast Present- Breast Mass and Breast Pain. Not Present- Nipple Discharge and Skin Changes. Cardiovascular Not Present- Chest Pain, Difficulty Breathing Lying Down, Leg Cramps, Palpitations, Rapid Heart Rate, Shortness of Breath and Swelling of Extremities. Gastrointestinal Not Present- Abdominal Pain, Bloating, Bloody Stool, Change in Bowel Habits, Chronic diarrhea, Constipation, Difficulty Swallowing, Excessive gas, Gets full quickly at meals, Hemorrhoids, Indigestion, Nausea, Rectal Pain and Vomiting. Female Genitourinary Not Present- Frequency, Nocturia, Painful Urination, Pelvic Pain and Urgency. Musculoskeletal Not Present- Back Pain, Joint Pain, Joint Stiffness, Muscle Pain, Muscle Weakness and Swelling of Extremities. Neurological Not Present- Decreased Memory, Fainting, Headaches, Numbness, Seizures, Tingling, Tremor, Trouble walking and Weakness. Psychiatric Not Present- Anxiety, Bipolar, Change in Sleep Pattern, Depression, Fearful and Frequent crying. Endocrine Not Present- Cold Intolerance, Excessive Hunger, Hair Changes, Heat Intolerance, Hot flashes and New Diabetes. Hematology Not Present- Easy Bruising, Excessive bleeding, Gland problems, HIV and Persistent Infections.  Vitals  Weight: 177 lb Height: 68in Body Surface Area: 1.94 m Body Mass Index: 26.91 kg/m  Temp.: 97.90F(Temporal)  Pulse: 76 (Regular)  BP: 124/76 (Sitting, Left Arm, Standard)  Physical Exam  General Mental Status-Alert. Orientation-Oriented X3. Breast Nipples-No Discharge. Breast Lump-No Palpable Breast Mass. Note: dense breast tissue bilaterally without discrete mass Lymphatic Head & Neck General Head & Neck Lymphatics: Bilateral -  Description - Normal. Axillary General Axillary Region: Bilateral - Description - Normal. 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 We discussed all of the different options for treatment for breast cancer including surgery, chemotherapy, radiation therapy, Herceptin, and antiestrogen therapy. We discussed a sentinel lymph node biopsy as she will likely undergo mastectomy with dcis due to possibility of some invasive disease. We discussed the performance of that with injection of radioactive tracer We discussed up to a 5% risk lifetime of chronic shoulder pain as well as lymphedema associated with a sentinel lymph node biopsy. I discussed consideration of double lumpectomy on trial but this would require biopsy of the third nodule. I think this is not best plan given her fh (although genetics negative), density and inability of mm to find lesion. I think a nsm with reconstruction is best option. she would like to consider bilateral nsm and I think this is a good option given above as well.

## 2015-06-29 NOTE — Progress Notes (Signed)
Assisted Dr. Veatrice Kells with right, left, ultrasound guided, pectoralis block. Side rails up, monitors on throughout procedure. See vital signs in flow sheet. Tolerated Procedure well.

## 2015-06-29 NOTE — Interval H&P Note (Signed)
History and Physical Interval Note:  06/29/2015 6:49 AM  Christina Logan  has presented today for surgery, with the diagnosis of RIGHT BREAST CANCER, FAMILY HISTORY OF BREAST CANCER  The various methods of treatment have been discussed with the patient and family. After consideration of risks, benefits and other options for treatment, the patient has consented to  Procedure(s): RIGHT NIPPLE SPARING MASTECTOMY WITH SENTINAL LYMPH NODE BIOPSY AND LEFT PROPHYLACTIC NIPPLE SPARING MASTECTOMY (Bilateral) BILATERAL BREAST RECONSTRUCTION WITH PLACEMENT OF TISSUE EXPANDER AND FLEX HD (ACELLULAR HYDRATED DERMIS) (Bilateral) as a surgical intervention .  The patient's history has been reviewed, patient examined, no change in status, stable for surgery.  I have reviewed the patient's chart and labs.  Questions were answered to the patient's satisfaction.     Donnamarie Shankles

## 2015-06-29 NOTE — Transfer of Care (Signed)
Immediate Anesthesia Transfer of Care Note  Patient: Christina Logan  Procedure(s) Performed: Procedure(s): RIGHT NIPPLE SPARING MASTECTOMY WITH SENTINAL LYMPH NODE BIOPSY AND LEFT PROPHYLACTIC NIPPLE SPARING MASTECTOMY (Bilateral) BILATERAL BREAST RECONSTRUCTION WITH PLACEMENT OF TISSUE EXPANDER AND FLEX HD (ACELLULAR HYDRATED DERMIS) (Bilateral)  Patient Location: PACU  Anesthesia Type:GA combined with regional for post-op pain  Level of Consciousness: awake, alert  and oriented  Airway & Oxygen Therapy: Patient Spontanous Breathing and Patient connected to face mask oxygen  Post-op Assessment: Report given to RN and Post -op Vital signs reviewed and stable  Post vital signs: Reviewed and stable  Last Vitals:  Filed Vitals:   06/29/15 0959 06/29/15 1000  BP: 125/92 125/92  Pulse: 99 101  Temp:    Resp: 29 34    Complications: No apparent anesthesia complications

## 2015-06-29 NOTE — Anesthesia Preprocedure Evaluation (Signed)
Anesthesia Evaluation  Patient identified by MRN, date of birth, ID band Patient awake    Reviewed: Allergy & Precautions, H&P , NPO status , Patient's Chart, lab work & pertinent test results  History of Anesthesia Complications Negative for: history of anesthetic complications  Airway Mallampati: II  TM Distance: >3 FB Neck ROM: full    Dental no notable dental hx.    Pulmonary neg pulmonary ROS,    Pulmonary exam normal breath sounds clear to auscultation       Cardiovascular Normal cardiovascular exam+ dysrhythmias  Rhythm:regular Rate:Normal     Neuro/Psych PSYCHIATRIC DISORDERS Anxiety Depression negative neurological ROS     GI/Hepatic negative GI ROS, Neg liver ROS, PUD, Ulcerative colitis   Endo/Other  negative endocrine ROS  Renal/GU negative Renal ROS     Musculoskeletal   Abdominal   Peds  Hematology   Anesthesia Other Findings   Reproductive/Obstetrics negative OB ROS                             Anesthesia Physical Anesthesia Plan  ASA: II  Anesthesia Plan: General and Regional   Post-op Pain Management: GA combined w/ Regional for post-op pain   Induction: Intravenous  Airway Management Planned: Oral ETT  Additional Equipment:   Intra-op Plan:   Post-operative Plan: Extubation in OR  Informed Consent: I have reviewed the patients History and Physical, chart, labs and discussed the procedure including the risks, benefits and alternatives for the proposed anesthesia with the patient or authorized representative who has indicated his/her understanding and acceptance.   Dental Advisory Given  Plan Discussed with: Anesthesiologist, CRNA and Surgeon  Anesthesia Plan Comments:         Anesthesia Quick Evaluation

## 2015-06-29 NOTE — Anesthesia Procedure Notes (Addendum)
Anesthesia Regional Block:  Pectoralis block  Pre-Anesthetic Checklist: ,, timeout performed, Correct Patient, Correct Site, Correct Laterality, Correct Procedure, Correct Position, site marked, Risks and benefits discussed,  Surgical consent,  Pre-op evaluation,  At surgeon's request and post-op pain management  Laterality: Right and Left  Prep: chloraprep       Needles:  Injection technique: Single-shot  Needle Type: Echogenic Stimulator Needle     Needle Length: 9cm 9 cm Needle Gauge: 21 and 21 G    Additional Needles:  Procedures: ultrasound guided (picture in chart) Pectoralis block Narrative:  Injection made incrementally with aspirations every 5 mL.  Performed by: Personally  Anesthesiologist: JUDD, BENJAMIN  Additional Notes: Risks, benefits and alternative to block explained extensively.  Patient tolerated procedure well, without complications.   Procedure Name: Intubation Date/Time: 06/29/2015 10:30 AM Performed by: Melynda Ripple D Pre-anesthesia Checklist: Patient identified, Emergency Drugs available, Suction available and Patient being monitored Patient Re-evaluated:Patient Re-evaluated prior to inductionOxygen Delivery Method: Circle System Utilized Preoxygenation: Pre-oxygenation with 100% oxygen Intubation Type: IV induction Ventilation: Mask ventilation without difficulty Laryngoscope Size: Mac and 3 Grade View: Grade I Tube type: Oral Tube size: 7.0 mm Number of attempts: 1 Airway Equipment and Method: Stylet and Oral airway Placement Confirmation: ETT inserted through vocal cords under direct vision,  positive ETCO2 and breath sounds checked- equal and bilateral Secured at: 23 cm Tube secured with: Tape Dental Injury: Teeth and Oropharynx as per pre-operative assessment

## 2015-06-30 ENCOUNTER — Encounter (HOSPITAL_BASED_OUTPATIENT_CLINIC_OR_DEPARTMENT_OTHER): Payer: Self-pay | Admitting: General Surgery

## 2015-06-30 DIAGNOSIS — K5190 Ulcerative colitis, unspecified, without complications: Secondary | ICD-10-CM | POA: Diagnosis not present

## 2015-06-30 DIAGNOSIS — Z79899 Other long term (current) drug therapy: Secondary | ICD-10-CM | POA: Diagnosis not present

## 2015-06-30 DIAGNOSIS — C50911 Malignant neoplasm of unspecified site of right female breast: Secondary | ICD-10-CM | POA: Diagnosis not present

## 2015-06-30 DIAGNOSIS — Z803 Family history of malignant neoplasm of breast: Secondary | ICD-10-CM | POA: Diagnosis not present

## 2015-06-30 DIAGNOSIS — Z4001 Encounter for prophylactic removal of breast: Secondary | ICD-10-CM | POA: Diagnosis not present

## 2015-06-30 DIAGNOSIS — F419 Anxiety disorder, unspecified: Secondary | ICD-10-CM | POA: Diagnosis not present

## 2015-06-30 DIAGNOSIS — F329 Major depressive disorder, single episode, unspecified: Secondary | ICD-10-CM | POA: Diagnosis not present

## 2015-06-30 DIAGNOSIS — D0511 Intraductal carcinoma in situ of right breast: Secondary | ICD-10-CM | POA: Diagnosis not present

## 2015-06-30 DIAGNOSIS — Z9012 Acquired absence of left breast and nipple: Secondary | ICD-10-CM | POA: Diagnosis not present

## 2015-06-30 MED ORDER — OXYCODONE HCL 5 MG PO TABS
5.0000 mg | ORAL_TABLET | ORAL | Status: DC | PRN
  Administered 2015-06-30: 10 mg via ORAL
  Filled 2015-06-30: qty 2

## 2015-06-30 MED ORDER — OXYCODONE HCL 5 MG PO TABS: 5.0000 mg | ORAL_TABLET | ORAL | Status: DC | PRN

## 2015-06-30 MED ORDER — KETOROLAC TROMETHAMINE 15 MG/ML IJ SOLN
INTRAMUSCULAR | Status: AC
  Filled 2015-06-30: qty 2

## 2015-06-30 MED ORDER — KETOROLAC TROMETHAMINE 30 MG/ML IJ SOLN
30.0000 mg | Freq: Once | INTRAMUSCULAR | Status: AC
  Administered 2015-06-30: 30 mg via INTRAVENOUS

## 2015-06-30 MED FILL — CLINDAMYCIN HCL 300 MG CAPS: 300 | 6 days supply | Qty: 18 | Fill #0

## 2015-06-30 MED FILL — METHOCARBAMOL 500 MG TABLET: 500 | 10 days supply | Qty: 30 | Fill #0

## 2015-06-30 MED FILL — oxyCODONE HCL 5 MG TABS: 5 | 4 days supply | Qty: 50 | Fill #0

## 2015-06-30 NOTE — Progress Notes (Signed)
1 Day Post-Op  Subjective: Complains of some pain otherwise well  Objective: Vital signs in last 24 hours: Temp:  [97 F (36.1 C)-98.8 F (37.1 C)] 97 F (36.1 C) (01/25 0200) Pulse Rate:  [57-101] 80 (01/25 0600) Resp:  [8-34] 18 (01/25 0600) BP: (111-137)/(67-92) 118/83 mmHg (01/25 0600) SpO2:  [94 %-100 %] 98 % (01/25 0845) Weight:  [78.472 kg (173 lb)] 78.472 kg (173 lb) (01/24 0930)    Intake/Output from previous day: 01/24 0701 - 01/25 0700 In: 4364 [P.O.:2064; I.V.:2200; IV Piggyback:100] Out: 4038 [Urine:3650; Drains:388] Intake/Output this shift:    Binder and dressing in place  Lab Results:  No results for input(s): WBC, HGB, HCT, PLT in the last 72 hours. BMET No results for input(s): NA, K, CL, CO2, GLUCOSE, BUN, CREATININE, CALCIUM in the last 72 hours. PT/INR No results for input(s): LABPROT, INR in the last 72 hours. ABG No results for input(s): PHART, HCO3 in the last 72 hours.  Invalid input(s): PCO2, PO2  Studies/Results: Nm Sentinel Node Inj-no Rpt (breast)  06/29/2015  CLINICAL DATA: right axillary sn biopsy Sulfur colloid was injected intradermally by the nuclear medicine technologist for breast cancer sentinel node localization.    Anti-infectives: Anti-infectives    Start     Dose/Rate Route Frequency Ordered Stop   06/29/15 1500  ceFAZolin (ANCEF) IVPB 1 g/50 mL premix     1 g 100 mL/hr over 30 Minutes Intravenous 3 times per day 06/29/15 1447 06/29/15 2308   06/29/15 1234  bacitracin 50,000 Units, gentamicin (GARAMYCIN) 80 mg, ceFAZolin (ANCEF) 1 g in sodium chloride 0.9 % 1,000 mL  Status:  Discontinued       As needed 06/29/15 1234 06/29/15 1412   06/29/15 0913  ceFAZolin (ANCEF) IVPB 2 g/50 mL premix     2 g 100 mL/hr over 30 Minutes Intravenous On call to O.R. 06/29/15 0913 06/29/15 1051   06/29/15 0000  clindamycin (CLEOCIN) 300 MG capsule     300 mg Oral 3 times daily 06/29/15 1449        Assessment/Plan: POD 1 right nsm with  ax an, left nsm  Hopefully home later today  [facility] 06/30/2015

## 2015-06-30 NOTE — Discharge Instructions (Signed)
°Post Anesthesia Home Care Instructions ° °Activity: °Get plenty of rest for the remainder of the day. A responsible adult should stay with you for 24 hours following the procedure.  °For the next 24 hours, DO NOT: °-Drive a car °-Operate machinery °-Drink alcoholic beverages °-Take any medication unless instructed by your physician °-Make any legal decisions or sign important papers. ° °Meals: °Start with liquid foods such as gelatin or soup. Progress to regular foods as tolerated. Avoid greasy, spicy, heavy foods. If nausea and/or vomiting occur, drink only clear liquids until the nausea and/or vomiting subsides. Call your physician if vomiting continues. ° °Special Instructions/Symptoms: °Your throat may feel dry or sore from the anesthesia or the breathing tube placed in your throat during surgery. If this causes discomfort, gargle with warm salt water. The discomfort should disappear within 24 hours. ° °If you had a scopolamine patch placed behind your ear for the management of post- operative nausea and/or vomiting: ° °1. The medication in the patch is effective for 72 hours, after which it should be removed.  Wrap patch in a tissue and discard in the trash. Wash hands thoroughly with soap and water. °2. You may remove the patch earlier than 72 hours if you experience unpleasant side effects which may include dry mouth, dizziness or visual disturbances. °3. Avoid touching the patch. Wash your hands with soap and water after contact with the patch. °  °About my Jackson-Pratt Bulb Drain ° °What is a Jackson-Pratt bulb? °A Jackson-Pratt is a soft, round device used to collect drainage. It is connected to a long, thin drainage catheter, which is held in place by one or two small stiches near your surgical incision site. When the bulb is squeezed, it forms a vacuum, forcing the drainage to empty into the bulb. ° °Emptying the Jackson-Pratt bulb- °To empty the bulb: °1. Release the plug on the top of the bulb. °2.  Pour the bulb's contents into a measuring container which your nurse will provide. °3. Record the time emptied and amount of drainage. Empty the drain(s) as often as your     doctor or nurse recommends. ° °Date                  Time                    Amount (Drain 1)                 Amount (Drain 2) ° °_____________________________________________________________________ ° °_____________________________________________________________________ ° °_____________________________________________________________________ ° °_____________________________________________________________________ ° °_____________________________________________________________________ ° °_____________________________________________________________________ ° °_____________________________________________________________________ ° °_____________________________________________________________________ ° °Squeezing the Jackson-Pratt Bulb- °To squeeze the bulb: °1. Make sure the plug at the top of the bulb is open. °2. Squeeze the bulb tightly in your fist. You will hear air squeezing from the bulb. °3. Replace the plug while the bulb is squeezed. °4. Use a safety pin to attach the bulb to your clothing. This will keep the catheter from     pulling at the bulb insertion site. ° °When to call your doctor- °Call your doctor if: °· Drain site becomes red, swollen or hot. °· You have a fever greater than 101 degrees F. °· There is oozing at the drain site. °· Drain falls out (apply a guaze bandage over the drain hole and secure it with tape). °· Drainage increases daily not related to activity patterns. (You will usually have more drainage when you are active than when you are resting.) °· Drainage has a bad   odor.  SACRAL DRESSING (Lower Back)   A pressure ulcer is a sore where the skin breaks open   This dressing will be placed on your lower back to protect this area from pressure and moisture and in many cases helps prevent pressure ulcers  from forming   A nurse may place this dressing before your surgery or another procedure   A nurse may also place this dressing if you have other conditions that put you at risk for developing a pressure ulcer   If you are getting up and moving around after surgery, the dressing may be taken off with your first shower. Simply remove it and throw it away.   While you are in the hospital, nurses will change the dressing twice a week as long as you are still at risk for developing a pressure ulcer   This dressing is latex free and made with silicone (for adhesive sensitivity) so it is safe and gentle to the skin

## 2015-07-01 ENCOUNTER — Other Ambulatory Visit: Payer: Self-pay | Admitting: *Deleted

## 2015-07-02 ENCOUNTER — Telehealth: Payer: Self-pay | Admitting: Hematology

## 2015-07-02 NOTE — Telephone Encounter (Signed)
Spoke with patient regarding surg f/u pof and she states that dr Burr Medico made her feel like she would never have to come back in and that she would see the surgeon in follow up,will send this to [facility]

## 2015-07-05 ENCOUNTER — Encounter: Payer: Self-pay | Admitting: *Deleted

## 2015-07-05 DIAGNOSIS — D0511 Intraductal carcinoma in situ of right breast: Secondary | ICD-10-CM

## 2015-07-07 ENCOUNTER — Other Ambulatory Visit: Payer: Self-pay | Admitting: Hematology

## 2015-07-07 ENCOUNTER — Telehealth: Payer: Self-pay | Admitting: Hematology

## 2015-07-07 DIAGNOSIS — Z9012 Acquired absence of left breast and nipple: Secondary | ICD-10-CM | POA: Diagnosis not present

## 2015-07-07 DIAGNOSIS — C50911 Malignant neoplasm of unspecified site of right female breast: Secondary | ICD-10-CM | POA: Diagnosis not present

## 2015-07-07 NOTE — Telephone Encounter (Signed)
Left message for patient re 2/14 f/u w/YF and 2/28 SCP visit. Schedule mailed.

## 2015-07-08 ENCOUNTER — Telehealth: Payer: Self-pay | Admitting: Hematology

## 2015-07-08 MED FILL — HYDROCODON-APAP 5-325: 5-325 | 3 days supply | Qty: 20 | Fill #0

## 2015-07-08 NOTE — Telephone Encounter (Signed)
I called pt and told her that I will cancel her appointment with me, she does not need follow up with me. I encouraged her to keep the survivorship clinic appointment. She voiced good understanding and agrees with the plan.  Truitt Merle  07/08/2015

## 2015-07-09 ENCOUNTER — Telehealth: Payer: Self-pay | Admitting: Hematology

## 2015-07-09 MED FILL — clonazePAM 0.5 MG TABS: 0.5 | 10 days supply | Qty: 10 | Fill #0

## 2015-07-09 NOTE — Telephone Encounter (Signed)
per pof to cancel pt appt-cld & spoke to pt and adv pt of appt time & date

## 2015-07-12 ENCOUNTER — Ambulatory Visit: Payer: 59 | Attending: Plastic Surgery | Admitting: Physical Therapy

## 2015-07-12 DIAGNOSIS — M25511 Pain in right shoulder: Secondary | ICD-10-CM | POA: Insufficient documentation

## 2015-07-12 DIAGNOSIS — M7582 Other shoulder lesions, left shoulder: Secondary | ICD-10-CM | POA: Diagnosis not present

## 2015-07-12 DIAGNOSIS — R6889 Other general symptoms and signs: Secondary | ICD-10-CM | POA: Diagnosis not present

## 2015-07-12 DIAGNOSIS — M25611 Stiffness of right shoulder, not elsewhere classified: Secondary | ICD-10-CM

## 2015-07-12 DIAGNOSIS — M25612 Stiffness of left shoulder, not elsewhere classified: Secondary | ICD-10-CM

## 2015-07-12 NOTE — Patient Instructions (Signed)
Flexors Stick Stretch I    Stand or sit, dowel in palm of arm to be stretched. Other arm, holding dowel at side and behind body, pushes arm being stretched forward and upward until straight over head. Hold _5__ seconds. Repeat __5_ times per session. Do _2__ sessions per day.  Copyright  VHI. All rights reserved.  Inferior Capsule Stick Stretch I    Stand or sit, dowel in palm of arm to be stretched. Other arm, holding dowel in front of body, pushes outward and upward until arm being stretched is as high to the side as possible. Hold __5_ seconds. Repeat _5__ times per session. Do _2__ sessions per day.  Copyright  VHI. All rights reserved.    Scapular Retraction (Standing)    With upper arms at sides and elbows bent (unlike in picture), turn hands out (external rotation of shoulders) and pinch shoulder blades together. Repeat __5__ times per set. Do __1__ sets per session. Do _2___ sessions per day.   Also do backwards shoulder rolls.  NO PAIN; KEEP ALL STRETCHES GENTLE AND TOLERABLE.  http://orth.exer.us/945   Copyright  VHI. All rights reserved.

## 2015-07-12 NOTE — Therapy (Signed)
[facility] [place], [place], [address] Phone: [phone]   Fax:  [phone]  Physical Therapy Evaluation  Patient Details  Name: Christina Logan MRN: [id number] Date of Birth: [birth year] Referring Provider: Dr. Irene Limbo  Encounter Date: 07/12/2015      PT End of Session - 07/12/15 1241    Visit Number 1   Number of Visits 9   Date for PT Re-Evaluation 08/09/15   PT Start Time 0950   PT Stop Time 1047   PT Time Calculation (min) 57 min   Activity Tolerance Patient tolerated treatment well   Behavior During Therapy [facility] for tasks assessed/performed      Past Medical History  Diagnosis Date  . PVCs (premature ventricular contractions)   . Ulcerative colitis ([place])   . Allergic rhinitis   . Dysrhythmia     occ PVC's, PAC's  . Anxiety   . Depression     Past Surgical History  Procedure Laterality Date  . Colonoscopy      2011/2012  . Right foot surgery    . Laproscopic exam for fertility    . Nipple sparing mastectomy/sentinal lymph node biopsy/reconstruction/placement of tissue expander Bilateral 06/29/2015    Procedure: RIGHT NIPPLE SPARING MASTECTOMY WITH SENTINAL LYMPH NODE BIOPSY AND LEFT PROPHYLACTIC NIPPLE SPARING MASTECTOMY;  Surgeon: Rolm Bookbinder, MD;  Location: [place];  Service: General;  Laterality: Bilateral;  . Breast reconstruction with placement of tissue expander and flex hd (acellular hydrated dermis) Bilateral 06/29/2015    Procedure: BILATERAL BREAST RECONSTRUCTION WITH PLACEMENT OF TISSUE EXPANDER AND FLEX HD (ACELLULAR HYDRATED DERMIS);  Surgeon: Irene Limbo, MD;  Location: [place];  Service: Plastics;  Laterality: Bilateral;    There were no vitals filed for this visit.  Visit Diagnosis:  Decreased ROM of right shoulder - Plan: PT plan of care cert/re-cert  Decreased ROM of left shoulder - Plan: PT plan of care  cert/re-cert  Impaired function of upper extremity - Plan: PT plan of care cert/re-cert      Subjective Assessment - 07/12/15 0949    Subjective Dr. Iran Planas says she wants her in the prayer position other than when she comes here.  Still has one drain on each side.   Pertinent History Christina Logan 50 y.o. female is here because of her recently diagnosed right breast DCIS.  Bilateral mastectomies with immediate expander placement 06/29/15; 4 sentinal nodes on right, all negative, no nodes on right.  No chemo nor radiation planned.  Will have fills and then reconstruction in about 3 months.   Patient Stated Goals get back to normal, back to work   Currently in Pain? Yes   Pain Score 3    Pain Location Other (Comment)  drain tube sites   Pain Orientation Right;Left;Lower   Pain Descriptors / Indicators Sharp;Tender  + stabbing her back where they did the nerve block   Pain Type Surgical pain   Pain Onset 1 to 4 weeks ago   Aggravating Factors  more movement   Pain Relieving Factors rest, new pain medicine, icing            [facility] PT Assessment - 07/12/15 0001    Assessment   Medical Diagnosis right breast DCIS with bilateral mastectomies   Referring Provider Dr. Irene Limbo   Onset Date/Surgical Date 06/29/15   Precautions   Precautions Other (comment)   Precaution Comments no lifting, limit use of arms except in therapy  Restrictions   Weight Bearing Restrictions No   Balance Screen   Has the patient fallen in the past 6 months No   Has the patient had a decrease in activity level because of a fear of falling?  No   Is the patient reluctant to leave their home because of a fear of falling?  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, 3x/week at least an hour   Cognition   Overall Cognitive Status Within Functional Limits for tasks assessed   Observation/Other Assessments   Observations comes in with pillow held up against her trunk at front   Skin Integrity incisions healing very well with glue in place; drain in place on each side   Quick DASH  75   Posture/Postural Control   Posture/Postural Control No significant limitations   ROM / Strength   AROM / PROM / Strength AROM;PROM   AROM   AROM Assessment Site Shoulder   Right/Left Shoulder Right;Left   Right Shoulder Flexion 144 Degrees  in sitting   Right Shoulder ABduction 117 Degrees   Right Shoulder Internal Rotation --  WFL in supine   Right Shoulder External Rotation 82 Degrees   Left Shoulder Flexion 151 Degrees   Left Shoulder ABduction 102 Degrees   Left Shoulder Internal Rotation --  [facility]   Left Shoulder External Rotation 69 Degrees   PROM   PROM Assessment Site Shoulder   Right/Left Shoulder Right;Left   Right Shoulder Flexion 158 Degrees   Right Shoulder ABduction 114 Degrees   Left Shoulder Flexion 150 Degrees   Left Shoulder ABduction 138 Degrees           LYMPHEDEMA/ONCOLOGY QUESTIONNAIRE - 07/12/15 1006    Type   Cancer Type right DCIS   Surgeries   Mastectomy Date 06/29/15  bilat.   Number Lymph Nodes Removed 4   Treatment   Past Chemotherapy Treatment No   Past Radiation Treatment No   Lymphedema Assessments   Lymphedema Assessments Upper extremities   Right Upper Extremity Lymphedema   10 cm Proximal to Olecranon Process 27.8 cm   Olecranon Process 25.1 cm   10 cm Proximal to Ulnar Styloid Process 21.8 cm   Just Proximal to Ulnar Styloid Process 16.6 cm   Across Hand at [employer] 20.2 cm   At [place] of 2nd Digit 6.2 cm   Left Upper Extremity Lymphedema   10 cm Proximal to Olecranon  Process 28.7 cm   Olecranon Process 24.8 cm   10 cm Proximal to Ulnar Styloid Process 22.6 cm   Just Proximal to Ulnar Styloid Process 16 cm   Across Hand at [employer] 19.9 cm   At [place] of 2nd Digit 6 cm           Quick Dash - 07/12/15 0001    Open a tight or new jar Unable   Do heavy household chores (wash walls, wash floors) Unable   Carry a shopping bag or briefcase Moderate difficulty   Wash your back Moderate difficulty   Use a knife to cut food Moderate difficulty  Recreational activities in which you take some force or impact through your arm, shoulder, or hand (golf, hammering, tennis) Unable   During the past week, to what extent has your arm, shoulder or hand problem interfered with your normal social activities with family, friends, neighbors, or groups? Extremely   During the past week, to what extent has your arm, shoulder or hand problem limited your work or other regular daily activities Extremely   Arm, shoulder, or hand pain. Moderate   Tingling (pins and needles) in your arm, shoulder, or hand Moderate   Difficulty Sleeping Severe difficulty   DASH Score 75 %                     PT Education - 07/12/15 1240    Education provided Yes   Education Details shoulder flexion and abduction with dowel, active ER with scapular retraction, and shoulder rolls backward   Person(s) Educated Patient   Methods Explanation;Demonstration;Handout   Comprehension Verbalized understanding;Returned demonstration                [place] - 07/12/15 1249    CC Long Term Goal  #1   Title Independent with HEP for bilateral shoulder ROM.   Time 2   Period Weeks   Status New   CC Long Term Goal  #2   Title bilateral shoulder flexion at least 160 degrees for improved overhead reach   Time 4   Period Weeks   Status New   CC Long Term Goal  #3   Title bilateral shoulder abduction at least 160 degrees for improved ADLs   Time 4   Period  Weeks   Status New            Plan - 07/12/15 1241    Clinical Impression Statement Patient who is just two weeks s/p bilateral mastectomies for right DCIS, immediate reconstruction with expanders placed and still with two drains in. She has good AROM for this early stage but it is limited bilaterally.  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  She had just sentinel node biopsy on the right side so is at low risk for lymhedema.  Did well with initial HEP instruction.  Began discussion of obtaining compression sleeve.   Pt will benefit from skilled therapeutic intervention in order to improve on the following deficits Decreased range of motion;Impaired UE functional use   Rehab Potential Excellent   Clinical Impairments Affecting Rehab Potential bilateral mastectomies are recent:  06/29/15   PT Frequency 2x / week   PT Duration 4 weeks   PT Treatment/Interventions Therapeutic exercise;Patient/family education;Passive range of motion;Manual techniques;ADLs/Self Care Home Management   PT Next Visit Plan Begin gentle P/AA/AROM for both shoulders, keeping in mind that drains are still in, so done gently.  Later include instruction in lymphedema risk reduction.  See if patient wants to work with [facility] re: obtaining a compression sleeve through her insurance.   PT Home Exercise Plan gentle AAROM   Consulted and Agree with Plan of Care Patient         Problem List Patient Active Problem List   Diagnosis Date Noted  . S/P mastectomy 06/29/2015  . 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 Palpitations 01/21/2014  . Family history of breast cancer 10/02/2011    Christina Logan 07/12/2015, 12:55 PM  [facility] [phone]  [place], [place], [address] Phone: [phone]   Fax:  [phone]  Name: Christina Logan MRN: [id number] Date of Birth: [birth year]   Serafina Royals, PT 07/12/2015 12:55 PM

## 2015-07-14 MED FILL — VALACYCLOVIR HCL 500 MG TAB: 500 | 5 days supply | Qty: 10 | Fill #1

## 2015-07-14 MED FILL — HYDROCODON-APAP 5-325: 5-325 | 4 days supply | Qty: 40 | Fill #0

## 2015-07-16 ENCOUNTER — Ambulatory Visit: Payer: 59 | Admitting: Physical Therapy

## 2015-07-16 DIAGNOSIS — M7582 Other shoulder lesions, left shoulder: Secondary | ICD-10-CM | POA: Diagnosis not present

## 2015-07-16 DIAGNOSIS — M25511 Pain in right shoulder: Secondary | ICD-10-CM | POA: Diagnosis not present

## 2015-07-16 DIAGNOSIS — M25611 Stiffness of right shoulder, not elsewhere classified: Secondary | ICD-10-CM

## 2015-07-16 DIAGNOSIS — R6889 Other general symptoms and signs: Secondary | ICD-10-CM

## 2015-07-16 DIAGNOSIS — M25612 Stiffness of left shoulder, not elsewhere classified: Secondary | ICD-10-CM

## 2015-07-16 NOTE — Therapy (Signed)
[place], [place], [address] Phone: [phone]   Fax:  [phone]  Physical Therapy Treatment  Patient Details  Name: Christina Logan MRN: [id number] Date of Birth: [birth year] Referring Provider: Dr. Irene Limbo  Encounter Date: 07/16/2015      PT End of Session - 07/16/15 0957    Visit Number 2   Number of Visits 9   Date for PT Re-Evaluation 08/09/15   PT Start Time 0845   PT Stop Time 0930   PT Time Calculation (min) 45 min   Activity Tolerance Patient tolerated treatment well   Behavior During Therapy [facility] for tasks assessed/performed      Past Medical History  Diagnosis Date  . PVCs (premature ventricular contractions)   . Ulcerative colitis ([place])   . Allergic rhinitis   . Dysrhythmia     occ PVC's, PAC's  . Anxiety   . Depression     Past Surgical History  Procedure Laterality Date  . Colonoscopy      2011/2012  . Right foot surgery    . Laproscopic exam for fertility    . Nipple sparing mastectomy/sentinal lymph node biopsy/reconstruction/placement of tissue expander Bilateral 06/29/2015    Procedure: RIGHT NIPPLE SPARING MASTECTOMY WITH SENTINAL LYMPH NODE BIOPSY AND LEFT PROPHYLACTIC NIPPLE SPARING MASTECTOMY;  Surgeon: Rolm Bookbinder, MD;  Location: [place];  Service: General;  Laterality: Bilateral;  . Breast reconstruction with placement of tissue expander and flex hd (acellular hydrated dermis) Bilateral 06/29/2015    Procedure: BILATERAL BREAST RECONSTRUCTION WITH PLACEMENT OF TISSUE EXPANDER AND FLEX HD (ACELLULAR HYDRATED DERMIS);  Surgeon: Irene Limbo, MD;  Location: [place];  Service: Plastics;  Laterality: Bilateral;    There were no vitals filed for this visit.  Visit Diagnosis:  Decreased ROM of right shoulder  Decreased ROM of left shoulder  Impaired function of upper extremity      Subjective Assessment -  07/16/15 0850    Subjective still having tightness in front of chest . Pt comes into day wearing pink compression bandeau as she felt she 'needed some compression"    Pertinent History  50 y.o. female is here because of her recently diagnosed right breast DCIS.  Bilateral mastectomies with immediate expander placement 06/29/15; 4 sentinal nodes on right, all negative, no nodes on right.  No chemo nor radiation planned.  Will have fills and then reconstruction in about 3 months.   Patient Stated Goals get back to normal, back to work   Currently in Pain? Yes   Pain Score 0-No pain                         [facility] Adult PT Treatment/Exercise - 07/16/15 0001    Self-Care   Self-Care Other Self-Care Comments   Other Self-Care Comments  provided 1/4 inch foam in tg soft for patient to wear at fullness at back under pink bandeau. Also provided medium tg soft for right upper arm for lymphatic support.    Exercises   Other Exercises  diphragmatic breathing  x 5 reps with hand over hand instuction    Lumbar Exercises: Supine   Other Supine Lumbar Exercises meeks spinal decompression  x 3 reps each with verbal cues    Other Supine Lumbar Exercises midrange lower trunk rotation within painfree range    Shoulder Exercises: Seated   Other Seated Exercises UE ranger at elbow height in sitting for gentle  active movement in multiple planes instructed patient to include gentle scapular movment as tolerated withing painfree range.  encouraged to have good posture    Manual Therapy   Manual Therapy Passive ROM   Passive ROM to both shoulders in multiple planes below 90 degrees with extra time on external rotation                 PT Education - 07/16/15 0948    Education provided Yes   Education Details meeks decomression with head on pillow as needed, where to get a compression sleeve, diaphragmatic breathing.                 Long Term Clinic Goals - 07/12/15 1249    CC Long  Term Goal  #1   Title Independent with HEP for bilateral shoulder ROM.   Time 2   Period Weeks   Status New   CC Long Term Goal  #2   Title bilateral shoulder flexion at least 160 degrees for improved overhead reach   Time 4   Period Weeks   Status New   CC Long Term Goal  #3   Title bilateral shoulder abduction at least 160 degrees for improved ADLs   Time 4   Period Weeks   Status New            Plan - 07/16/15 0957    Clinical Impression Statement Christina Logan appears to be doing very well.  Incisions are healing well.  Drains are scheduled to be removed next Wednesday, so exercise again with limited to below 90 degrees.  She was able to perform deep breathing and beginning core activation. She  has minor pain with PROM and anticipate she will progress well with ROM once drains are removed.  Mild post operative fullness at right mid back and upper noticed and should improve with mild compression. .   Feel she will  progress with home program and should be ready to advance active exercise and stretching next visit.   Pt will benefit from skilled therapeutic intervention in order to improve on the following deficits Decreased range of motion;Impaired UE functional use   Rehab Potential Excellent   Clinical Impairments Affecting Rehab Potential bilateral mastectomies are recent:  06/29/15   PT Frequency 2x / week   PT Duration 4 weeks   PT Next Visit Plan Progress A and P ROM of shuolders,  If pt ready, begin strength ABC instruction.  Begin instruction in lymphedema risk reduction.scheduel for ABC class   check to see if we need to help with obtaining a compression sleeve. Geanie Cooley and Agree with Plan of Care Patient        Problem List Patient Active Problem List   Diagnosis Date Noted  . S/P mastectomy 06/29/2015  . 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 Palpitations 01/21/2014  . Family history of breast cancer 10/02/2011   Donato Heinz. Owens Shark,  PT  07/16/2015, 10:05 AM  [place], [place], [address] Phone: [phone]   Fax:  [phone]  Name: Christina Logan MRN: [id number] Date of Birth: 1966-05-18

## 2015-07-19 ENCOUNTER — Ambulatory Visit: Payer: 59 | Admitting: Physical Therapy

## 2015-07-20 ENCOUNTER — Ambulatory Visit: Payer: 59 | Admitting: Hematology

## 2015-07-21 ENCOUNTER — Ambulatory Visit: Payer: 59 | Admitting: Physical Therapy

## 2015-07-21 DIAGNOSIS — Z760 Encounter for issue of repeat prescription: Secondary | ICD-10-CM | POA: Diagnosis not present

## 2015-07-26 ENCOUNTER — Ambulatory Visit: Payer: 59 | Admitting: Physical Therapy

## 2015-07-28 ENCOUNTER — Ambulatory Visit: Payer: 59 | Admitting: Physical Therapy

## 2015-07-28 DIAGNOSIS — R6889 Other general symptoms and signs: Secondary | ICD-10-CM | POA: Diagnosis not present

## 2015-07-28 DIAGNOSIS — M7582 Other shoulder lesions, left shoulder: Secondary | ICD-10-CM | POA: Diagnosis not present

## 2015-07-28 DIAGNOSIS — M25511 Pain in right shoulder: Secondary | ICD-10-CM | POA: Diagnosis not present

## 2015-07-28 DIAGNOSIS — M25611 Stiffness of right shoulder, not elsewhere classified: Secondary | ICD-10-CM

## 2015-07-28 DIAGNOSIS — M25612 Stiffness of left shoulder, not elsewhere classified: Secondary | ICD-10-CM

## 2015-07-28 NOTE — Therapy (Signed)
[facility] [address] [place], [place], [address] Phone: [phone]   Fax:  [phone]  Physical Therapy Treatment  Patient Details  Name: Christina Logan MRN: [id number] Date of Birth: 09/05/1965 Referring Provider: Dr. Brinda Thimmappa  Encounter Date: 07/28/2015      PT End of Session - 07/28/15 2116    Visit Number 3   Number of Visits 3   Date for PT Re-Evaluation 08/09/15   PT Start Time 1438   PT Stop Time 1522   PT Time Calculation (min) 44 min   Activity Tolerance Patient tolerated treatment well   Behavior During Therapy WFL for tasks assessed/performed      Past Medical History  Diagnosis Date  . PVCs (premature ventricular contractions)   . Ulcerative colitis (HCC)   . Allergic rhinitis   . Dysrhythmia     occ PVC's, PAC's  . Anxiety   . Depression     Past Surgical History  Procedure Laterality Date  . Colonoscopy      2011/2012  . Right foot surgery    . Laproscopic exam for fertility    . Nipple sparing mastectomy/sentinal lymph node biopsy/reconstruction/placement of tissue expander Bilateral 06/29/2015    Procedure: RIGHT NIPPLE SPARING MASTECTOMY WITH SENTINAL LYMPH NODE BIOPSY AND LEFT PROPHYLACTIC NIPPLE SPARING MASTECTOMY;  Surgeon: Matthew Wakefield, MD;  Location: [facility];  Service: General;  Laterality: Bilateral;  . Breast reconstruction with placement of tissue expander and flex hd (acellular hydrated dermis) Bilateral 06/29/2015    Procedure: BILATERAL BREAST RECONSTRUCTION WITH PLACEMENT OF TISSUE EXPANDER AND FLEX HD (ACELLULAR HYDRATED DERMIS);  Surgeon: Brinda Thimmappa, MD;  Location: [facility];  Service: Plastics;  Laterality: Bilateral;    There were no vitals filed for this visit.  Visit Diagnosis:  Decreased ROM of right shoulder  Decreased ROM of left shoulder  Impaired function of upper extremity      Subjective Assessment -  07/28/15 1439    Subjective Got the drains out last week but she told me to act like I still had them in for a week.  I go back to her after this.   Currently in Pain? Yes   Pain Score 3    Pain Location Axilla  and across chest   Pain Orientation Right;Left   Pain Descriptors / Indicators Tingling;Numbness   Aggravating Factors  nothing   Pain Relieving Factors nothing            [facility] PT Assessment - 07/28/15 0001    AROM   Right Shoulder Flexion 154 Degrees   Right Shoulder ABduction 180 Degrees   Right Shoulder External Rotation 90 Degrees   Left Shoulder Flexion 161 Degrees   Left Shoulder ABduction 180 Degrees   Left Shoulder External Rotation 90 Degrees                     [facility] Adult PT Treatment/Exercise - 07/28/15 0001    Self-Care   Other Self-Care Comments  discussed when to use compression sleeve, including for return to work   Exercises   Other Exercises  verbal instruction in strength ABC program   Manual Therapy   Manual Therapy Manual Lymphatic Drainage (MLD)   Manual Lymphatic Drainage (MLD) In supine, short neck, superficial and deep abdomen, right groin and axillo-inguinal anastomosis, then same on left side; in left sidelying, right axillo-inguinal anastomosis and in right sidelying, left axillo-inguinal anastomosis.                  PT Education - 07/28/15 2113    Education provided Yes   Education Details strength ABC program, info about lymphedema risk reduction given and when to wear compression sleeve   Person(s) Educated Patient   Methods Explanation;Handout   Comprehension Verbalized understanding                Long Term Clinic Goals - 07/28/15 1446    CC Long Term Goal  #1   Title Independent with HEP for bilateral shoulder ROM.   Status Achieved   CC Long Term Goal  #2   Title bilateral shoulder flexion at least 160 degrees for improved overhead reach   Status Partially Met   CC Long Term Goal  #3   Title  bilateral shoulder abduction at least 160 degrees for improved ADLs   Status Achieved            Plan - 07/28/15 2119    Clinical Impression Statement Patient is much improved, now not needing a pillow for support.and moving with much greater ease.  Her shoulder AROM is also significantly improved.  She has met most goals and is ready for discharge.     Pt will benefit from skilled therapeutic intervention in order to improve on the following deficits Decreased range of motion;Impaired UE functional use   Rehab Potential Excellent   Clinical Impairments Affecting Rehab Potential bilateral mastectomies are recent:  06/29/15   PT Treatment/Interventions Manual lymph drainage;ADLs/Self Care Home Management;Patient/family education   PT Next Visit Plan None; discharge today.   PT Home Exercise Plan AAROM   Consulted and Agree with Plan of Care Patient        Problem List Patient Active Problem List   Diagnosis Date Noted  . S/P mastectomy 06/29/2015  . 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 Palpitations 01/21/2014  . Family history of breast cancer 10/02/2011    SALISBURY,DONNA 07/28/2015, 9:25 PM  [facility] [address] [place], [place], [address] Phone: [phone]   Fax:  [phone]  Name: Christina Logan MRN: [id number] Date of Birth: 06/01/1966    PHYSICAL THERAPY DISCHARGE SUMMARY  Visits from Start of Care: 3  Current functional level related to goals / functional outcomes: Goals nearly met as noted above.   Remaining deficits: Still with mildly limited shoulder AROM.   Education / Equipment: HEP for ROM, strength ABC program, lymphedema risk reduction and obtaining a compression sleeve. Plan: Patient agrees to discharge.  Patient goals were partially met. Patient is being discharged due to meeting the stated rehab goals.  ?????    Donna Salisbury, PT 07/28/2015 9:25  PM    

## 2015-08-02 ENCOUNTER — Encounter: Payer: Self-pay | Admitting: Nurse Practitioner

## 2015-08-02 ENCOUNTER — Encounter: Payer: 59 | Admitting: Physical Therapy

## 2015-08-02 DIAGNOSIS — C50911 Malignant neoplasm of unspecified site of right female breast: Secondary | ICD-10-CM | POA: Insufficient documentation

## 2015-08-03 ENCOUNTER — Encounter: Payer: Self-pay | Admitting: Nurse Practitioner

## 2015-08-03 ENCOUNTER — Ambulatory Visit (HOSPITAL_BASED_OUTPATIENT_CLINIC_OR_DEPARTMENT_OTHER): Payer: 59 | Admitting: Nurse Practitioner

## 2015-08-03 VITALS — BP 122/71 | HR 92 | Temp 98.8°F | Resp 17 | Ht 68.0 in | Wt 174.9 lb

## 2015-08-03 DIAGNOSIS — G4700 Insomnia, unspecified: Secondary | ICD-10-CM

## 2015-08-03 DIAGNOSIS — D0511 Intraductal carcinoma in situ of right breast: Secondary | ICD-10-CM | POA: Diagnosis not present

## 2015-08-03 NOTE — Progress Notes (Signed)
CLINIC:  Cancer Survivorship   REASON FOR VISIT:  Routine follow-up post-treatment for a recent history of breast cancer.  BRIEF ONCOLOGIC HISTORY:    Breast cancer, right breast ([place])   10/02/2011 Procedure BRCAnalysis through [employer] [employer] and [facility] panel testing through [facility]: negative   05/14/2015 Mammogram Bilateral: negative   05/24/2015 Breast MRI Clumped linear/nodular NME within the UIQ of the right breast posterior depth spans 2.4 x 2.0 x 2.1 cm. There is also a second area of NME located laterally at 8:30 .5 x 2.0 x 1.4 cm in size   06/09/2015 Initial Biopsy Right breast x 2 (UIQ and LOQ): DCIS, low grade, ER+ (100%), PR+ (100%)   06/09/2015 Clinical Stage Stage 0: Tis N0   06/29/2015 Definitive Surgery Bilateral mastectomies (left: prophlyactic, no malignancy)/SLNB: right - multifocal DCIS with necrosis. 4 LN removed and negative   06/29/2015 Pathologic Stage Stage 0: Tis N0    INTERVAL HISTORY:  Ms. Satchell presents to the [facility] today for our initial meeting to review her survivorship care plan detailing her treatment course for breast cancer, as well as monitoring long-term side effects of that treatment, education regarding health maintenance, screening, and overall wellness and health promotion.     Overall, Ms. Sano reports feeling quite well since her surgery last month. She is seeing Dr. Iran Planas every month for serial filling of her expanders with plans for implant exchange later this spring. She has some tenderness and tightness along her chest and has been undergoing physical therapy, which she has now completed.  She denies any change along her expanders.  She denies headache, cough, shortness of breath or bone pain.  She has a good appetite and denies any weight loss.  She has some insomnia due to pain from the expanders.  REVIEW OF SYSTEMS:  General: Denies fever, chills, unintentional weight loss, or generalized fatigue.  HEENT:  Denies visual changes, hearing loss, mouth sores or difficulty swallowing. Cardiac: Denies palpitations, chest pain, and lower extremity edema.  Respiratory: Denies wheeze or dyspnea on exertion.  Breast: Pain as above related to breast expanders. Numbness and tingling along breast on occasion. GI: Denies abdominal pain, constipation, diarrhea, nausea, or vomiting.  GU: Denies dysuria, hematuria, vaginal bleeding, vaginal discharge, or vaginal dryness.  Musculoskeletal: Denies joint or bone pain.  Neuro: Denies recent fall or numbness / tingling in her extremities. Skin: Denies rash, pruritis, or open wounds.  Psych: Denies depression, anxiety, or memory loss.   A 14-point review of systems was completed and was negative, except as noted above.   ONCOLOGY TREATMENT TEAM:  1. Surgeon:  Dr. Donne Hazel at [facility]  2. Medical Oncologist: Dr. Burr Medico 3. Plastic Surgeon: Dr. Iran Planas    PAST MEDICAL/SURGICAL HISTORY:  Past Medical History  Diagnosis Date  . PVCs (premature ventricular contractions)   . Ulcerative colitis ([place])   . Allergic rhinitis   . Dysrhythmia     occ PVC's, PAC's  . Anxiety   . Depression    Past Surgical History  Procedure Laterality Date  . Colonoscopy      2011/2012  . Right foot surgery    . Laproscopic exam for fertility    . Nipple sparing mastectomy/sentinal lymph node biopsy/reconstruction/placement of tissue expander Bilateral 06/29/2015    Procedure: RIGHT NIPPLE SPARING MASTECTOMY WITH SENTINAL LYMPH NODE BIOPSY AND LEFT PROPHYLACTIC NIPPLE SPARING MASTECTOMY;  Surgeon: Rolm Bookbinder, MD;  Location: [facility];  Service: General;  Laterality: Bilateral;  . Breast  reconstruction with placement of tissue expander and flex hd (acellular hydrated dermis) Bilateral 06/29/2015    Procedure: BILATERAL BREAST RECONSTRUCTION WITH PLACEMENT OF TISSUE EXPANDER AND FLEX HD (ACELLULAR HYDRATED DERMIS);  Surgeon: Irene Limbo,  MD;  Location: [facility];  Service: Plastics;  Laterality: Bilateral;     ALLERGIES:  Allergies  Allergen Reactions  . Adhesive [Tape] Rash  . Sulfa Antibiotics Rash     CURRENT MEDICATIONS:  Current Outpatient Prescriptions on File Prior to Visit  Medication Sig Dispense Refill  . buPROPion (WELLBUTRIN SR) 100 MG 12 hr tablet Take 150 mg by mouth 2 (two) times daily.     . Cholecalciferol (VITAMIN D) 2000 units CAPS Take 1 capsule by mouth daily.    . clonazePAM (KLONOPIN) 0.5 MG tablet Take 0.5 mg by mouth as needed.  0  . folic acid (FOLVITE) 1 MG tablet Take 1 mg by mouth daily.    Marland Kitchen HYDROcodone-acetaminophen (NORCO) 10-325 MG tablet Take 1 tablet by mouth every 6 (six) hours as needed.    . methocarbamol (ROBAXIN) 500 MG tablet Take 1 tablet (500 mg total) by mouth every 8 (eight) hours as needed for muscle spasms. 30 tablet 0   No current facility-administered medications on file prior to visit.     ONCOLOGIC FAMILY HISTORY:  Family History  Problem Relation Age of Onset  . Breast cancer Mother 65  . Diverticulosis Father   . Breast cancer Sister 104    died at 49  . Thyroid cancer Maternal Grandmother     dx 35-40 years  . Breast cancer Maternal Grandmother 79  . Brain cancer Maternal Grandfather 58    glioblastoma  . Breast cancer Other     MGMs mother dx >50     GENETIC COUNSELING/TESTING: Yes, performed 10/02/2011 BRCAnalysis through [employer] and [employer] testing through [facility] [facility]: negative   SOCIAL HISTORY:  AUSTIN PONGRATZ is married and lives with her family in [place], [place]. She has one daughter, who is 26. Ms. Kerman is currently not working (on leave) but works part time at [employer].  She denies any current or history of tobacco or illicit drug use.  She uses alcohol infrequently.   PHYSICAL EXAMINATION:  Vital Signs: Filed Vitals:   08/03/15 1455 08/03/15 1456  BP: 122/71 122/71  Pulse:  92 92  Temp: 98.8 F (37.1 C) 98.8 F (37.1 C)  Resp: 17 17   ECOG Performance Status: 0  General: Well-nourished, well-appearing female in no acute distress.  She is unaccompanied in clinic today.   HEENT: Head is atraumatic and normocephalic.  Pupils equal and reactive to light and accomodation. Conjunctivae clear without exudate.  Sclerae anicteric. Oral mucosa is pink, moist, and intact without lesions.  Oropharynx is pink without lesions or erythema.  Lymph: No cervical, supraclavicular, infraclavicular, or axillary lymphadenopathy noted on palpation.  Cardiovascular: Regular rate and rhythm without murmurs, rubs, or gallops. Respiratory: Clear to auscultation bilaterally. Chest expansion symmetric without accessory muscle use on inspiration or expiration.  GI: Abdomen soft and round. No tenderness to palpation. Bowel sounds normoactive in 4 quadrants. GU: Deferred. Neuro: No focal deficits. Steady gait.  Psych: Mood and affect normal and appropriate for situation.  Extremities: No edema, cyanosis, or clubbing.  Skin: Warm and dry. No open lesions noted.   LABORATORY DATA:  None for this visit.  DIAGNOSTIC IMAGING:  None for this visit.     ASSESSMENT AND PLAN:   1. 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  Ms. Golonka is doing well without clinical symptoms worrisome for disease recurrence. She will follow-up with her surgeon, Dr. Donne Hazel, and plastic surgeon, Dr. Iran Planas, with history and physical examination per surveillance protocol. She states that there are plans for her to undergo expander replacement / implant exchange later this spring.  As she is s/p bilateral mastectomy with no evidence of invasive disease, she will not undergo  adjuvant endocrine therapy.  She has been discharged from physical therapy and will report if her symptoms recur. A comprehensive survivorship care plan and treatment summary was reviewed with the patient today detailing her breast cancer diagnosis, treatment course, potential late/long-term effects of treatment, appropriate follow-up care with recommendations for the future, and patient education resources.  A copy of this summary, along with a letter will be sent to the patient's primary care provider via in basket message after today's visit.  Ms. Arceneaux is welcome to return to the Survivorship Clinic in the future, as needed; no follow-up will be scheduled at this time.    2. Cancer screening:  Due to Ms. Hussar's history and her age, she should receive screening for skin cancers, colon cancer (beginning at age 72), and gynecologic cancers.  The information and recommendations are listed on the patient's comprehensive care plan/treatment summary and were reviewed in detail with the patient.    3. Health maintenance and wellness promotion: Ms. Berrett was encouraged to consume 5-7 servings of fruits and vegetables per day. We reviewed the "Nutrition Rainbow" handout, as well as discussed recommendations to maximize nutrition and minimize recurrence, such as increased intake of fruits, vegetables, lean proteins, and minimizing the intake of red meats and processed foods.  She was also encouraged to engage in moderate to vigorous exercise for 30 minutes per day most days of the week. We discussed the [employer] fitness program, which is designed for cancer survivors to help them become more physically fit after cancer treatments.  She was instructed to limit her alcohol consumption and continue to abstain from tobacco use.  A copy of the "Take Control of Your Health" brochure was given to her reinforcing these recommendations.   4. Support services/counseling: It is not uncommon for this period  of the patient's cancer care trajectory to be one of many emotions and stressors.  We discussed an opportunity for her to participate in the next session of [facility] ("Finding Your New Normal") support group series designed for patients after they have completed treatment.  Ms. Zandi was encouraged to take advantage of our many other support services programs, support groups, and/or counseling in coping with her new life as a cancer survivor after completing anti-cancer treatment.  She was offered support today through active listening and expressive supportive counseling.  She was given information regarding our available services and encouraged to contact me with any questions or for help enrolling in any of our support group/programs.    A total of 45 minutes of face-to-face time was spent with this patient with greater than 50% of that time in counseling and care-coordination.   Sylvan Cheese, NP  Survivorship Program [facility] [phone]   Note: PRIMARY CARE PROVIDER [place], [place] [phone]

## 2015-08-04 ENCOUNTER — Ambulatory Visit: Payer: 59 | Admitting: Physical Therapy

## 2015-08-04 ENCOUNTER — Encounter: Payer: 59 | Admitting: Physical Therapy

## 2015-08-31 DIAGNOSIS — N390 Urinary tract infection, site not specified: Secondary | ICD-10-CM | POA: Diagnosis not present

## 2015-08-31 DIAGNOSIS — R309 Painful micturition, unspecified: Secondary | ICD-10-CM | POA: Diagnosis not present

## 2015-09-19 NOTE — H&P (Signed)
  Subjective:    Patient ID: Christina Logan is a 50 y.o. female.  HPI 3 months post op bilateral nipple sparing mastectomies.  Patient with strong family history breast cancer, presented with right breast pain and screening MMG negative. Obtained MRI with NME within the UIQ of the right breast 2.4 x 2.0 x 2.1 cm. There was also a second area of NME laterally within the right breast at the 8:30- 9 o'clock position 2.5 x 2.0 x 1.4 cm in size. There is an irregular enhancing mass located within the right breast at the 3:30 o'clock position measuring 7 x 7 x 8 mm. Biopsy of UIQ and LOQ DCIS. Final pathology with multifocal DCIS, margins clear, SLN negative.  Genetic testing 2013 negative.   Prior 16 A/B cup, desires "full C." Right mastectomy weight 270 g Left mastectomy 241 g.       Objective:   Physical Exam  Cardiovascular: Normal rate, regular rhythm and normal heart sounds.  Pulmonary/Chest: Effort normal and breath sounds normal.  Abdominal: Soft.  No hernias     SN to nipple R 20 L 20 cm BW R 12.5 L 12 cm Nipple to IMF R 6.5 L 6 cm  Assessment:     DCIS right breast Family history breast cancer S/p bilateral NSM, TE/ADM reconstruction    Plan:  Plan second stage for implant exchange, lipofilling. Patient has elected for textured silicone, ok with anatomic or round.   Reviewed goal of fat grafting to aid in contour and thicken mastectomy flaps so that things like rippling or animation are less visible. Counseled cannot assure her cup size but understands she desires significant increase in size from pre mastectomies. Reviewed variable take graft, may need to repeat. Discussed compression garment for 4-6 weeks. Plan harvest abdomen and flanks. Reviewed risks implants including rupture, need for MRI surveillance with silicone, contracture, need to additional surgery, rotation, risk ALCL with textured devices. Additional risks including but not limited to bleeding,  seroma, hematoma, damage to deeper structures, unacceptable cosmetic result, DVT/PE reviewed.  Plan for 6 weeks off from work. Plan for OP surgery. She desires dose Toradol before going home.   Natrelle 300 ml [id number] tissue expanders placed bilaterally, RIGHT initial fill volume 335 ml  LEFT initial fill volume 335 ml   Irene Limbo, MD [facility] Plastic [facility] Reconstructive Surgery [phone]

## 2015-09-21 ENCOUNTER — Encounter (HOSPITAL_BASED_OUTPATIENT_CLINIC_OR_DEPARTMENT_OTHER): Payer: Self-pay | Admitting: *Deleted

## 2015-09-27 MED FILL — BUPROPION HCL SR 150 MG TAB: 150 | 90 days supply | Qty: 180 | Fill #0

## 2015-09-28 ENCOUNTER — Encounter (HOSPITAL_BASED_OUTPATIENT_CLINIC_OR_DEPARTMENT_OTHER): Payer: Self-pay | Admitting: Certified Registered"

## 2015-09-28 ENCOUNTER — Ambulatory Visit (HOSPITAL_BASED_OUTPATIENT_CLINIC_OR_DEPARTMENT_OTHER): Payer: 59 | Admitting: Certified Registered"

## 2015-09-28 ENCOUNTER — Ambulatory Visit (HOSPITAL_BASED_OUTPATIENT_CLINIC_OR_DEPARTMENT_OTHER)
Admission: RE | Admit: 2015-09-28 | Discharge: 2015-09-28 | Disposition: A | Discharge status: Home / Self Care | Payer: 59 | Source: Ambulatory Visit | Attending: Plastic Surgery | Admitting: Plastic Surgery

## 2015-09-28 ENCOUNTER — Encounter (HOSPITAL_BASED_OUTPATIENT_CLINIC_OR_DEPARTMENT_OTHER)
Admission: RE | Disposition: A | Discharge status: Home / Self Care | Payer: Self-pay | Source: Ambulatory Visit | Attending: Plastic Surgery

## 2015-09-28 DIAGNOSIS — Z853 Personal history of malignant neoplasm of breast: Secondary | ICD-10-CM | POA: Diagnosis not present

## 2015-09-28 DIAGNOSIS — Z9013 Acquired absence of bilateral breasts and nipples: Secondary | ICD-10-CM | POA: Insufficient documentation

## 2015-09-28 DIAGNOSIS — Z803 Family history of malignant neoplasm of breast: Secondary | ICD-10-CM | POA: Diagnosis not present

## 2015-09-28 DIAGNOSIS — Z421 Encounter for breast reconstruction following mastectomy: Secondary | ICD-10-CM | POA: Insufficient documentation

## 2015-09-28 DIAGNOSIS — Z9010 Acquired absence of unspecified breast and nipple: Secondary | ICD-10-CM | POA: Diagnosis not present

## 2015-09-28 HISTORY — PX: REMOVAL OF BILATERAL TISSUE EXPANDERS WITH PLACEMENT OF BILATERAL BREAST IMPLANTS: SHX6431

## 2015-09-28 HISTORY — PX: LIPOSUCTION WITH LIPOFILLING: SHX6436

## 2015-09-28 SURGERY — REMOVAL, TISSUE EXPANDER, BREAST, BILATERAL, WITH BILATERAL IMPLANT IMPLANT INSERTION
Anesthesia: General | Site: Chest | Laterality: Bilateral

## 2015-09-28 MED ORDER — SCOPOLAMINE 1 MG/3DAYS TD PT72
MEDICATED_PATCH | TRANSDERMAL | Status: AC
  Filled 2015-09-28: qty 1

## 2015-09-28 MED ORDER — LIDOCAINE HCL 1 % IJ SOLN
INTRAVENOUS | Status: DC | PRN
  Administered 2015-09-28: 400 mL via INTRAMUSCULAR

## 2015-09-28 MED ORDER — KETOROLAC TROMETHAMINE 30 MG/ML IJ SOLN
INTRAMUSCULAR | Status: DC | PRN
  Administered 2015-09-28: 30 mg via INTRAVENOUS

## 2015-09-28 MED ORDER — FENTANYL CITRATE (PF) 100 MCG/2ML IJ SOLN
50.0000 ug | INTRAMUSCULAR | Status: AC | PRN
  Administered 2015-09-28 (×3): 25 ug via INTRAVENOUS
  Administered 2015-09-28: 50 ug via INTRAVENOUS
  Administered 2015-09-28: 25 ug via INTRAVENOUS
  Administered 2015-09-28: 100 ug via INTRAVENOUS

## 2015-09-28 MED ORDER — HYDROCODONE-ACETAMINOPHEN 5-325 MG PO TABS
1.0000 | ORAL_TABLET | Freq: Once | ORAL | Status: AC
  Administered 2015-09-28: 1 via ORAL

## 2015-09-28 MED ORDER — SUCCINYLCHOLINE CHLORIDE 20 MG/ML IJ SOLN
INTRAMUSCULAR | Status: AC
  Filled 2015-09-28: qty 1

## 2015-09-28 MED ORDER — CEFAZOLIN SODIUM-DEXTROSE 2-4 GM/100ML-% IV SOLN
INTRAVENOUS | Status: AC
  Filled 2015-09-28: qty 100

## 2015-09-28 MED ORDER — FENTANYL CITRATE (PF) 100 MCG/2ML IJ SOLN
INTRAMUSCULAR | Status: AC
  Filled 2015-09-28: qty 2

## 2015-09-28 MED ORDER — HYDROCODONE-ACETAMINOPHEN 7.5-325 MG PO TABS: 1.0000 | ORAL_TABLET | Freq: Once | ORAL | Status: DC | PRN

## 2015-09-28 MED ORDER — SODIUM CHLORIDE 0.9 % IR SOLN
Status: DC | PRN
  Administered 2015-09-28: 1000 mL

## 2015-09-28 MED ORDER — CEFAZOLIN SODIUM-DEXTROSE 2-4 GM/100ML-% IV SOLN
2.0000 g | INTRAVENOUS | Status: AC
  Administered 2015-09-28: 2 g via INTRAVENOUS

## 2015-09-28 MED ORDER — DEXAMETHASONE SODIUM PHOSPHATE 10 MG/ML IJ SOLN
INTRAMUSCULAR | Status: AC
  Filled 2015-09-28: qty 1

## 2015-09-28 MED ORDER — SODIUM BICARBONATE 4 % IV SOLN
INTRAVENOUS | Status: AC
  Filled 2015-09-28: qty 5

## 2015-09-28 MED ORDER — SCOPOLAMINE 1 MG/3DAYS TD PT72
1.0000 | MEDICATED_PATCH | Freq: Once | TRANSDERMAL | Status: DC | PRN
  Administered 2015-09-28: 1.5 mg via TRANSDERMAL

## 2015-09-28 MED ORDER — LIDOCAINE HCL (PF) 1 % IJ SOLN
INTRAMUSCULAR | Status: AC
  Filled 2015-09-28: qty 30

## 2015-09-28 MED ORDER — LACTATED RINGERS IV SOLN
INTRAVENOUS | Status: DC
  Administered 2015-09-28 (×2): via INTRAVENOUS

## 2015-09-28 MED ORDER — BUPIVACAINE-EPINEPHRINE (PF) 0.25% -1:200000 IJ SOLN
INTRAMUSCULAR | Status: AC
  Filled 2015-09-28: qty 30

## 2015-09-28 MED ORDER — DEXAMETHASONE SODIUM PHOSPHATE 4 MG/ML IJ SOLN
INTRAMUSCULAR | Status: DC | PRN
  Administered 2015-09-28: 10 mg via INTRAVENOUS

## 2015-09-28 MED ORDER — HYDROMORPHONE HCL 1 MG/ML IJ SOLN
INTRAMUSCULAR | Status: AC
  Filled 2015-09-28: qty 1

## 2015-09-28 MED ORDER — LIDOCAINE HCL (CARDIAC) 20 MG/ML IV SOLN
INTRAVENOUS | Status: AC
  Filled 2015-09-28: qty 5

## 2015-09-28 MED ORDER — MIDAZOLAM HCL 2 MG/2ML IJ SOLN
INTRAMUSCULAR | Status: AC
Start: 2015-09-28 — End: 2015-09-28
  Filled 2015-09-28: qty 2

## 2015-09-28 MED ORDER — ONDANSETRON HCL 4 MG/2ML IJ SOLN
INTRAMUSCULAR | Status: DC | PRN
  Administered 2015-09-28: 4 mg via INTRAVENOUS

## 2015-09-28 MED ORDER — EPINEPHRINE HCL 1 MG/ML IJ SOLN
INTRAMUSCULAR | Status: AC
  Filled 2015-09-28: qty 1

## 2015-09-28 MED ORDER — CHLORHEXIDINE GLUCONATE 4 % EX LIQD: 1.0000 "application " | Freq: Once | CUTANEOUS | Status: DC

## 2015-09-28 MED ORDER — LIDOCAINE HCL (CARDIAC) 20 MG/ML IV SOLN
INTRAVENOUS | Status: DC | PRN
Start: 2015-09-28 — End: 2015-09-28
  Administered 2015-09-28: 100 mg via INTRAVENOUS

## 2015-09-28 MED ORDER — PROPOFOL 500 MG/50ML IV EMUL
INTRAVENOUS | Status: AC
  Filled 2015-09-28: qty 50

## 2015-09-28 MED ORDER — METOCLOPRAMIDE HCL 5 MG/ML IJ SOLN: 10.0000 mg | Freq: Once | INTRAMUSCULAR | Status: DC | PRN

## 2015-09-28 MED ORDER — SODIUM CHLORIDE 0.9 % IV SOLN
INTRAVENOUS | Status: DC | PRN
  Administered 2015-09-28: 10:00:00

## 2015-09-28 MED ORDER — PROPOFOL 10 MG/ML IV BOLUS
INTRAVENOUS | Status: DC | PRN
  Administered 2015-09-28: 180 mg via INTRAVENOUS

## 2015-09-28 MED ORDER — LIDOCAINE HCL 4 % EX SOLN
CUTANEOUS | Status: DC | PRN
  Administered 2015-09-28: 2 mL via TOPICAL

## 2015-09-28 MED ORDER — ONDANSETRON HCL 4 MG/2ML IJ SOLN
INTRAMUSCULAR | Status: AC
  Filled 2015-09-28: qty 2

## 2015-09-28 MED ORDER — MIDAZOLAM HCL 2 MG/2ML IJ SOLN
1.0000 mg | INTRAMUSCULAR | Status: DC | PRN
  Administered 2015-09-28: 2 mg via INTRAVENOUS

## 2015-09-28 MED ORDER — HYDROCODONE-ACETAMINOPHEN 10-325 MG PO TABS: 1.0000 | ORAL_TABLET | Freq: Four times a day (QID) | ORAL | Status: DC | PRN

## 2015-09-28 MED ORDER — GLYCOPYRROLATE 0.2 MG/ML IJ SOLN: 0.2000 mg | Freq: Once | INTRAMUSCULAR | Status: DC | PRN

## 2015-09-28 MED ORDER — HYDROMORPHONE HCL 1 MG/ML IJ SOLN
0.2500 mg | INTRAMUSCULAR | Status: DC | PRN
  Administered 2015-09-28 (×3): 0.5 mg via INTRAVENOUS

## 2015-09-28 MED ORDER — CIPROFLOXACIN HCL 500 MG PO TABS
500.0000 mg | ORAL_TABLET | Freq: Two times a day (BID) | ORAL | Status: DC
Start: 2015-09-28 — End: 2016-02-16

## 2015-09-28 MED ORDER — ARTIFICIAL TEARS OP OINT
TOPICAL_OINTMENT | OPHTHALMIC | Status: AC
  Filled 2015-09-28: qty 3.5

## 2015-09-28 MED ORDER — SUCCINYLCHOLINE CHLORIDE 20 MG/ML IJ SOLN
INTRAMUSCULAR | Status: DC | PRN
  Administered 2015-09-28: 75 mg via INTRAVENOUS

## 2015-09-28 MED ORDER — ARTIFICIAL TEARS OP OINT
TOPICAL_OINTMENT | OPHTHALMIC | Status: DC | PRN
  Administered 2015-09-28: 1 via OPHTHALMIC

## 2015-09-28 MED ORDER — ATROPINE SULFATE 0.4 MG/ML IJ SOLN
INTRAMUSCULAR | Status: AC
  Filled 2015-09-28: qty 1

## 2015-09-28 MED ORDER — MEPERIDINE HCL 25 MG/ML IJ SOLN: 6.2500 mg | INTRAMUSCULAR | Status: DC | PRN

## 2015-09-28 MED ORDER — HYDROCODONE-ACETAMINOPHEN 5-325 MG PO TABS
ORAL_TABLET | ORAL | Status: AC
  Filled 2015-09-28: qty 1

## 2015-09-28 MED FILL — HYDROCODON-APAP 10-325: 10-325 | 8 days supply | Qty: 30 | Fill #0

## 2015-09-28 MED FILL — CIPROFLOXACIN HCL 500 MG TA: 500 | 7 days supply | Qty: 14 | Fill #0

## 2015-09-28 SURGICAL SUPPLY — 85 items
BAG DECANTER FOR FLEXI CONT (MISCELLANEOUS) ×4 IMPLANT
BINDER ABDOMINAL 10 UNV 27-48 (MISCELLANEOUS) ×2 IMPLANT
BINDER ABDOMINAL 12 SM 30-45 (SOFTGOODS) IMPLANT
BINDER BREAST LRG (GAUZE/BANDAGES/DRESSINGS) ×2 IMPLANT
BINDER BREAST MEDIUM (GAUZE/BANDAGES/DRESSINGS) IMPLANT
BINDER BREAST XLRG (GAUZE/BANDAGES/DRESSINGS) IMPLANT
BINDER BREAST XXLRG (GAUZE/BANDAGES/DRESSINGS) IMPLANT
BLADE SURG 10 STRL SS (BLADE) IMPLANT
BLADE SURG 11 STRL SS (BLADE) ×4 IMPLANT
BLADE SURG 15 STRL LF DISP TIS (BLADE) ×2 IMPLANT
BLADE SURG 15 STRL SS (BLADE) ×4
BNDG GAUZE ELAST 4 BULKY (GAUZE/BANDAGES/DRESSINGS) ×8 IMPLANT
CANISTER LIPO FAT HARVEST (MISCELLANEOUS) ×4 IMPLANT
CANISTER SUCT 1200ML W/VALVE (MISCELLANEOUS) ×6 IMPLANT
CHLORAPREP W/TINT 26ML (MISCELLANEOUS) ×4 IMPLANT
COMPRESSION GARMENT LG MOREWEL (MISCELLANEOUS) IMPLANT
COMPRESSION GARMENT MD MOREWEL (MISCELLANEOUS) IMPLANT
COMPRESSION GARMENT XL MOREWEL (MISCELLANEOUS) IMPLANT
COVER BACK TABLE 60X90IN (DRAPES) ×4 IMPLANT
COVER MAYO STAND STRL (DRAPES) ×4 IMPLANT
DECANTER SPIKE VIAL GLASS SM (MISCELLANEOUS) IMPLANT
DRAIN CHANNEL 15F RND FF W/TCR (WOUND CARE) ×6 IMPLANT
DRAPE IMP U-DRAPE 54X76 (DRAPES) IMPLANT
DRAPE LAPAROSCOPIC ABDOMINAL (DRAPES) ×4 IMPLANT
DRSG PAD ABDOMINAL 8X10 ST (GAUZE/BANDAGES/DRESSINGS) ×8 IMPLANT
ELECT BLADE 4.0 EZ CLEAN MEGAD (MISCELLANEOUS) ×4
ELECT COATED BLADE 2.86 ST (ELECTRODE) ×4 IMPLANT
ELECT REM PT RETURN 9FT ADLT (ELECTROSURGICAL) ×4
ELECTRODE BLDE 4.0 EZ CLN MEGD (MISCELLANEOUS) ×2 IMPLANT
ELECTRODE REM PT RTRN 9FT ADLT (ELECTROSURGICAL) ×2 IMPLANT
EVACUATOR SILICONE 100CC (DRAIN) ×4 IMPLANT
FILTER LIPOSUCTION (MISCELLANEOUS) ×4 IMPLANT
GLOVE BIO SURGEON STRL SZ 6 (GLOVE) ×4 IMPLANT
GLOVE BIO SURGEON STRL SZ 6.5 (GLOVE) ×1 IMPLANT
GLOVE BIO SURGEONS STRL SZ 6.5 (GLOVE) ×1
GLOVE BIOGEL PI IND STRL 6.5 (GLOVE) IMPLANT
GLOVE BIOGEL PI INDICATOR 6.5 (GLOVE)
GLOVE SURG SS PI 7.0 STRL IVOR (GLOVE) ×2 IMPLANT
GOWN STRL REUS W/ TWL LRG LVL3 (GOWN DISPOSABLE) ×4 IMPLANT
GOWN STRL REUS W/TWL LRG LVL3 (GOWN DISPOSABLE) ×12
IMPLANT BREAST MX 410 (Breast) ×4 IMPLANT
IV NS 500ML (IV SOLUTION)
IV NS 500ML BAXH (IV SOLUTION) IMPLANT
KIT FILL SYSTEM UNIVERSAL (SET/KITS/TRAYS/PACK) IMPLANT
LINER CANISTER 1000CC FLEX (MISCELLANEOUS) ×4 IMPLANT
LIQUID BAND (GAUZE/BANDAGES/DRESSINGS) ×10 IMPLANT
NDL HYPO 25X1 1.5 SAFETY (NEEDLE) IMPLANT
NDL SAFETY ECLIPSE 18X1.5 (NEEDLE) ×2 IMPLANT
NEEDLE HYPO 18GX1.5 SHARP (NEEDLE) ×4
NEEDLE HYPO 25X1 1.5 SAFETY (NEEDLE) IMPLANT
NS IRRIG 1000ML POUR BTL (IV SOLUTION) ×6 IMPLANT
PACK BASIN DAY SURGERY FS (CUSTOM PROCEDURE TRAY) ×4 IMPLANT
PACK UNIVERSAL I (CUSTOM PROCEDURE TRAY) IMPLANT
PAD ALCOHOL SWAB (MISCELLANEOUS) ×4 IMPLANT
PENCIL BUTTON HOLSTER BLD 10FT (ELECTRODE) ×4 IMPLANT
PIN SAFETY STERILE (MISCELLANEOUS) ×4 IMPLANT
SHEET MEDIUM DRAPE 40X70 STRL (DRAPES) ×8 IMPLANT
SIZER BREAST GENERIC (SIZER) ×4 IMPLANT
SIZER BREAST REUSE FX 450CC (SIZER) ×2 IMPLANT
SIZER BREAST REUSE MX 410CC (SIZER) ×2 IMPLANT
SLEEVE SCD COMPRESS KNEE MED (MISCELLANEOUS) ×4 IMPLANT
SPONGE LAP 18X18 X RAY DECT (DISPOSABLE) ×8 IMPLANT
STAPLER VISISTAT 35W (STAPLE) ×4 IMPLANT
SUT ETHILON 2 0 FS 18 (SUTURE) ×6 IMPLANT
SUT MNCRL AB 4-0 PS2 18 (SUTURE) ×8 IMPLANT
SUT PDS 3-0 CT2 (SUTURE)
SUT PDS AB 2-0 CT2 27 (SUTURE) ×4 IMPLANT
SUT PDS II 3-0 CT2 27 ABS (SUTURE) IMPLANT
SUT VIC AB 3-0 PS1 18 (SUTURE)
SUT VIC AB 3-0 PS1 18XBRD (SUTURE) IMPLANT
SUT VIC AB 3-0 SH 27 (SUTURE) ×8
SUT VIC AB 3-0 SH 27X BRD (SUTURE) ×4 IMPLANT
SUT VICRYL 4-0 PS2 18IN ABS (SUTURE) ×8 IMPLANT
SYR 50ML LL SCALE MARK (SYRINGE) ×8 IMPLANT
SYR BULB IRRIGATION 50ML (SYRINGE) ×4 IMPLANT
SYR CONTROL 10ML LL (SYRINGE) IMPLANT
SYR TB 1ML LL NO SAFETY (SYRINGE) ×4 IMPLANT
SYRINGE 10CC LL (SYRINGE) ×12 IMPLANT
TOWEL OR 17X24 6PK STRL BLUE (TOWEL DISPOSABLE) ×8 IMPLANT
TUBE CONNECTING 20'X1/4 (TUBING) ×1
TUBE CONNECTING 20X1/4 (TUBING) ×3 IMPLANT
TUBING INFILTRATION IT-10001 (TUBING) ×4 IMPLANT
TUBING SET GRADUATE ASPIR 12FT (MISCELLANEOUS) ×4 IMPLANT
UNDERPAD 30X30 (UNDERPADS AND DIAPERS) ×8 IMPLANT
YANKAUER SUCT BULB TIP NO VENT (SUCTIONS) ×4 IMPLANT

## 2015-09-28 NOTE — Op Note (Addendum)
Operative Note   DATE OF OPERATION: 4.25.17  LOCATION: [facility]  SURGICAL DIVISION: Plastic Surgery  PREOPERATIVE DIAGNOSES:  1. History breast cancer 2. Acquired absence bilateral breasts  POSTOPERATIVE DIAGNOSES:  same  PROCEDURE:  1. Removal bilateral tissue expanders and placement anatomic silicone implants 2. Lipofilling to bilateral reconstructed breasts  SURGEON: Irene Limbo MD MBA  ASSISTANT: none  ANESTHESIA:  General.   EBL: 75 ml  COMPLICATIONS: None immediate.   INDICATIONS FOR PROCEDURE:  The patient, Christina Logan, is a 50 y.o. female born on Oct 10, 1965, is here for second stage reconstruction following bilateral nipple sparing mastectomies and immediate expander acellular dermis reconstruction.   FINDINGS: Incorporated acellular dermis. Natrelle Style 410 MX 410 implants placed bilateral. REF [id number] RIGHT SN [id number] LEFT SN [id number]. Right breast 90 ml fat infiltrated and 100 ml of left breast.  DESCRIPTION OF PROCEDURE:  The patient's operative site was marked with the patient in the preoperative area to mark sternal notch, anterior axillary lines, chest midline. She demonstrated asymmetry inframammary folds with left higher and this was marked symmetric with left for lowering of this fold. Supra and infraumbilical abdomen marked for donor site for fat grafting. The patient was taken to the operating room. SCDs were placed and IV antibiotics were given. The patient's operative site was prepped and draped in a sterile fashion. A time out was performed and all information was confirmed to be correct. Incision made in right IMF and carried to capsule. Expander removed, full incorporation acellular dermis noted. Partial anterior capsulectomy performed at superior pole of capsule to enhance adherence with anatomic device. Sizer placed. I then directed attention to left chest. Incision made in IMF and carried to capsule. Expander removed and again  full incorporation acellular dermis noted. Capsulotomies performed to lower inframammary fold to be symmetric with right. Additional capsulotomies performed medially to expected implant dimensions. Partial anterior capsulectomy performed over superior pole to assist with adherence. The IMF was set to chest wall with interrupted 2-0 PDS from cut capsule margin to chest wall fascia. Sizer placed and patient brought to sitting position. Style MX 410 ml implants chosen. Patient returned to supine position.   Stab incision made over bilateral lateral abdomen and tumescent fluid infiltrated over supraumbilical and infraumbilical abdomen, total 400 ml tumescent infiltrated. Power assisted liposuction performed to endpoint symmetric contour and soft tissue thickness. The fat was then washed and prepared by gravity for infiltration. Harvested fat was then infiltrated in subcutaneous and intramuscular planes over superior medial poles, lateral chest wall, and throughout mastectomy flaps.   At this time breast cavities irrigated with solution containing Ancef, gentamicin, and bacitracin, followed by Betadine. 15 Fr drain placed in each cavity, and secured to skin with 2-0 nylon. Implant placed in right breast cavity. Care taken to ensure proper orientation. Closure was completed with running 3-0 vicryl for approximation of muscle and superficial fascia. 4-0 vicryl was placed in dermis and running 4-0 monocryl was used to close skin.Similar closure performed on left breast following implant placement.  Tissue adhesive applied to breast incisions. The abdominal incisions were approximated with 4-0 monocryl. Dry dressing and abdominal and breast binders applied.   The patient was allowed to wake from anesthesia, extubated and taken to the recovery room in satisfactory condition.   SPECIMENS: none  DRAINS: 15 Fr JP in bilateral reconstructed breasts  Irene Limbo, MD [facility] Plastic & Reconstructive  Surgery [phone]

## 2015-09-28 NOTE — Transfer of Care (Signed)
Immediate Anesthesia Transfer of Care Note  Patient: Christina Logan  Procedure(s) Performed: Procedure(s): REMOVAL OF BILATERAL TISSUE EXPANDERS WITH PLACEMENT OF BILATERAL BREAST IMPLANTS (Bilateral) LIPO FILLING FROM ABDOMEN TO BILATERAL CHEST (Bilateral)  Patient Location: PACU  Anesthesia Type:General  Level of Consciousness: awake, alert  and patient cooperative  Airway & Oxygen Therapy: Patient Spontanous Breathing and Patient connected to face mask oxygen  Post-op Assessment: Report given to RN, Post -op Vital signs reviewed and stable and Patient moving all extremities  Post vital signs: Reviewed and stable  Last Vitals:  Filed Vitals:   09/28/15 0723  BP: 117/81  Pulse: 92  Temp: 36.6 C  Resp: 18    Complications: No apparent anesthesia complications

## 2015-09-28 NOTE — Interval H&P Note (Signed)
History and Physical Interval Note:  09/28/2015 6:50 AM  Christina Logan  has presented today for surgery, with the diagnosis of HISTORY OF BREAST CANCER, ACQUIRED ABSENCE OF BREAST  The various methods of treatment have been discussed with the patient and family. After consideration of risks, benefits and other options for treatment, the patient has consented to  Procedure(s): REMOVAL OF BILATERAL TISSUE EXPANDERS WITH PLACEMENT OF BILATERAL BREAST IMPLANTS (Bilateral) LIPO FILLING FROM ABDOMEN TO BILATERAL CHEST (Bilateral) as a surgical intervention .  The patient's history has been reviewed, patient examined, no change in status, stable for surgery.  I have reviewed the patient's chart and labs.  Questions were answered to the patient's satisfaction.     Cristobal Advani

## 2015-09-28 NOTE — Discharge Instructions (Signed)
°Post Anesthesia Home Care Instructions ° °Activity: °Get plenty of rest for the remainder of the day. A responsible adult should stay with you for 24 hours following the procedure.  °For the next 24 hours, DO NOT: °-Drive a car °-Operate machinery °-Drink alcoholic beverages °-Take any medication unless instructed by your physician °-Make any legal decisions or sign important papers. ° °Meals: °Start with liquid foods such as gelatin or soup. Progress to regular foods as tolerated. Avoid greasy, spicy, heavy foods. If nausea and/or vomiting occur, drink only clear liquids until the nausea and/or vomiting subsides. Call your physician if vomiting continues. ° °Special Instructions/Symptoms: °Your throat may feel dry or sore from the anesthesia or the breathing tube placed in your throat during surgery. If this causes discomfort, gargle with warm salt water. The discomfort should disappear within 24 hours. ° °If you had a scopolamine patch placed behind your ear for the management of post- operative nausea and/or vomiting: ° °1. The medication in the patch is effective for 72 hours, after which it should be removed.  Wrap patch in a tissue and discard in the trash. Wash hands thoroughly with soap and water. °2. You may remove the patch earlier than 72 hours if you experience unpleasant side effects which may include dry mouth, dizziness or visual disturbances. °3. Avoid touching the patch. Wash your hands with soap and water after contact with the patch. °  °About my Jackson-Pratt Bulb Drain ° °What is a Jackson-Pratt bulb? °A Jackson-Pratt is a soft, round device used to collect drainage. It is connected to a long, thin drainage catheter, which is held in place by one or two small stiches near your surgical incision site. When the bulb is squeezed, it forms a vacuum, forcing the drainage to empty into the bulb. ° °Emptying the Jackson-Pratt bulb- °To empty the bulb: °1. Release the plug on the top of the bulb. °2.  Pour the bulb's contents into a measuring container which your nurse will provide. °3. Record the time emptied and amount of drainage. Empty the drain(s) as often as your     doctor or nurse recommends. ° °Date                  Time                    Amount (Drain 1)                 Amount (Drain 2) ° °_____________________________________________________________________ ° °_____________________________________________________________________ ° °_____________________________________________________________________ ° °_____________________________________________________________________ ° °_____________________________________________________________________ ° °_____________________________________________________________________ ° °_____________________________________________________________________ ° °_____________________________________________________________________ ° °Squeezing the Jackson-Pratt Bulb- °To squeeze the bulb: °1. Make sure the plug at the top of the bulb is open. °2. Squeeze the bulb tightly in your fist. You will hear air squeezing from the bulb. °3. Replace the plug while the bulb is squeezed. °4. Use a safety pin to attach the bulb to your clothing. This will keep the catheter from     pulling at the bulb insertion site. ° °When to call your doctor- °Call your doctor if: °· Drain site becomes red, swollen or hot. °· You have a fever greater than 101 degrees F. °· There is oozing at the drain site. °· Drain falls out (apply a guaze bandage over the drain hole and secure it with tape). °· Drainage increases daily not related to activity patterns. (You will usually have more drainage when you are active than when you are resting.) °· Drainage has a bad   odor. ° ° °

## 2015-09-28 NOTE — Anesthesia Postprocedure Evaluation (Signed)
Anesthesia Post Note  Patient: Christina Logan  Procedure(s) Performed: Procedure(s) (LRB): REMOVAL OF BILATERAL TISSUE EXPANDERS WITH PLACEMENT OF BILATERAL BREAST IMPLANTS (Bilateral) LIPO FILLING FROM ABDOMEN TO BILATERAL CHEST (Bilateral)  Patient location during evaluation: PACU Anesthesia Type: General Level of consciousness: awake and alert and oriented Pain management: pain level controlled Vital Signs Assessment: post-procedure vital signs reviewed and stable Respiratory status: spontaneous breathing, nonlabored ventilation and respiratory function stable Cardiovascular status: blood pressure returned to baseline and stable Postop Assessment: no signs of nausea or vomiting Anesthetic complications: no    Last Vitals:  Filed Vitals:   09/28/15 1032 09/28/15 1033  BP: 122/85   Pulse: 95 90  Temp:    Resp: 24 15    Last Pain:  Filed Vitals:   09/28/15 1038  PainSc: Asleep                 Khamya Topp A.

## 2015-09-28 NOTE — Addendum Note (Signed)
Addendum  created 09/28/15 1350 by Baxter Flattery, CRNA   Modules edited: Anesthesia Flowsheet

## 2015-09-28 NOTE — Anesthesia Procedure Notes (Signed)
Procedure Name: Intubation Date/Time: 09/28/2015 8:03 AM Performed by: Baxter Flattery Pre-anesthesia Checklist: Patient identified, Emergency Drugs available, Suction available and Patient being monitored Patient Re-evaluated:Patient Re-evaluated prior to inductionOxygen Delivery Method: Circle System Utilized Preoxygenation: Pre-oxygenation with 100% oxygen Intubation Type: IV induction Ventilation: Mask ventilation without difficulty Laryngoscope Size: Miller and 2 Grade View: Grade I Tube type: Oral Tube size: 7.0 mm Number of attempts: 1 Airway Equipment and Method: Stylet,  Oral airway and LTA kit utilized Placement Confirmation: ETT inserted through vocal cords under direct vision,  positive ETCO2 and breath sounds checked- equal and bilateral Secured at: 21 cm Tube secured with: Tape Dental Injury: Teeth and Oropharynx as per pre-operative assessment

## 2015-09-28 NOTE — Anesthesia Preprocedure Evaluation (Addendum)
Anesthesia Evaluation  Patient identified by MRN, date of birth, ID band Patient awake    Reviewed: Allergy & Precautions, NPO status , Patient's Chart, lab work & pertinent test results  Airway Mallampati: III  TM Distance: >3 FB Neck ROM: Full    Dental no notable dental hx. (+) Teeth Intact   Pulmonary neg pulmonary ROS,    Pulmonary exam normal breath sounds clear to auscultation       Cardiovascular negative cardio ROS Normal cardiovascular exam Rhythm:Regular Rate:Normal     Neuro/Psych PSYCHIATRIC DISORDERS Anxiety Depression negative neurological ROS     GI/Hepatic Neg liver ROS, PUD, Hx/o Ulcerative colitis   Endo/Other  Hx/o Right Breast Ca  Renal/GU negative Renal ROS  negative genitourinary   Musculoskeletal negative musculoskeletal ROS (+)   Abdominal   Peds  Hematology negative hematology ROS (+)   Anesthesia Other Findings   Reproductive/Obstetrics negative OB ROS                           Anesthesia Physical Anesthesia Plan  ASA: II  Anesthesia Plan: General   Post-op Pain Management:    Induction: Intravenous  Airway Management Planned: LMA  Additional Equipment:   Intra-op Plan:   Post-operative Plan: Extubation in OR  Informed Consent: I have reviewed the patients History and Physical, chart, labs and discussed the procedure including the risks, benefits and alternatives for the proposed anesthesia with the patient or authorized representative who has indicated his/her understanding and acceptance.   Dental advisory given  Plan Discussed with: CRNA, Anesthesiologist and Surgeon  Anesthesia Plan Comments:         Anesthesia Quick Evaluation

## 2015-09-29 ENCOUNTER — Encounter (HOSPITAL_BASED_OUTPATIENT_CLINIC_OR_DEPARTMENT_OTHER): Payer: Self-pay | Admitting: Plastic Surgery

## 2015-10-25 ENCOUNTER — Other Ambulatory Visit: Payer: Self-pay

## 2015-10-25 NOTE — Patient Outreach (Addendum)
[place] [facility]) Care Management  10/25/2015  DEZTINY DEJONG [birth year] [id number]   Phone call for high cost referral assessment.   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  She is recovering from reconstructive surgery in April.  States she will return to work in 2 weeks.  States she is independent with her ADL/IADLs and does not need any assistance.  States she did go to some of the Yoga classes at the [facility] before her last surgery and she would like to go back when she is healed.  She does have a question about a charge for her compression sleeve for lymphedema that [employer] did not cover. Instructed on [employer] and services available.  Instructed on how to contact if needed in the future.  Instructed to call [employer] and Human Resources about coverage of sleeve and instructed she might need a benefit exception Member assessed with no further intervention needed at this time. Peter Garter RN, [facility] Care Management Coordinator-Link to [place] Management [phone]

## 2015-10-29 ENCOUNTER — Ambulatory Visit: Payer: 59

## 2015-10-29 DIAGNOSIS — Z30432 Encounter for removal of intrauterine contraceptive device: Secondary | ICD-10-CM | POA: Diagnosis not present

## 2015-10-29 DIAGNOSIS — Z6826 Body mass index (BMI) 26.0-26.9, adult: Secondary | ICD-10-CM | POA: Diagnosis not present

## 2015-10-29 DIAGNOSIS — Z01419 Encounter for gynecological examination (general) (routine) without abnormal findings: Secondary | ICD-10-CM | POA: Diagnosis not present

## 2015-11-08 DIAGNOSIS — L821 Other seborrheic keratosis: Secondary | ICD-10-CM | POA: Diagnosis not present

## 2015-11-08 DIAGNOSIS — D225 Melanocytic nevi of trunk: Secondary | ICD-10-CM | POA: Diagnosis not present

## 2015-11-08 DIAGNOSIS — D1801 Hemangioma of skin and subcutaneous tissue: Secondary | ICD-10-CM | POA: Diagnosis not present

## 2015-11-15 DIAGNOSIS — R102 Pelvic and perineal pain: Secondary | ICD-10-CM | POA: Diagnosis not present

## 2015-11-19 DIAGNOSIS — K5190 Ulcerative colitis, unspecified, without complications: Secondary | ICD-10-CM | POA: Diagnosis not present

## 2015-11-19 DIAGNOSIS — Z13220 Encounter for screening for lipoid disorders: Secondary | ICD-10-CM | POA: Diagnosis not present

## 2015-11-19 DIAGNOSIS — D0511 Intraductal carcinoma in situ of right breast: Secondary | ICD-10-CM | POA: Diagnosis not present

## 2015-11-19 DIAGNOSIS — F411 Generalized anxiety disorder: Secondary | ICD-10-CM | POA: Diagnosis not present

## 2015-11-19 DIAGNOSIS — D473 Essential (hemorrhagic) thrombocythemia: Secondary | ICD-10-CM | POA: Diagnosis not present

## 2015-11-19 DIAGNOSIS — Z0000 Encounter for general adult medical examination without abnormal findings: Secondary | ICD-10-CM | POA: Diagnosis not present

## 2015-11-19 MED FILL — clonazePAM 0.5 MG TABS: 0.5 | 10 days supply | Qty: 10 | Fill #0

## 2015-11-25 ENCOUNTER — Other Ambulatory Visit: Payer: Self-pay | Admitting: *Deleted

## 2015-11-25 NOTE — Patient Outreach (Signed)
[place] [facility]) Care Management  11/25/2015  Christina Logan [birth year] [id number]  RN attempted [name] call to pt today however pt not available. RN able to leave a HIPAA approved voice message and will await a call back concerning pt's request for a benefit exception for a compression sleeve.  Raina Mina, RN Care Management Coordinator [place] Office [phone]

## 2015-11-29 ENCOUNTER — Other Ambulatory Visit: Payer: Self-pay | Admitting: *Deleted

## 2015-11-29 NOTE — Patient Outreach (Signed)
[facility]) Care Management  11/29/2015  Christina Logan 09/20/1965 [id number]   RN received a call responding to the voice message left to pt by this RN last week. RN returned the call once again requesting information concerning her request for the benefit exception. RN currently awaiting a call back or requested information to proceed with the benefit exception. Note pt has requested coverage for a compression sleeve post op breast cancer treatment. Sleeve received from a non-networking provider [employer]. Will inquire further once RN has spoke with pt concerning her requested benefit exception.  Raina Mina, RN Care Management Coordinator [place] Office [phone]

## 2015-12-01 ENCOUNTER — Other Ambulatory Visit: Payer: Self-pay | Admitting: *Deleted

## 2015-12-01 NOTE — Patient Outreach (Signed)
[place] [facility]) Care Management  12/01/2015  KAMREY STEIMLE [birth year] [id number]   RN attempted to reach pt once again today however unsuccessful. RN able to leave voice message requested a more convenient time or date to contact pt along with available days and times this RN will be available. RN also contact the office [facility] and alerted if pt contacts the office what is needed to proceed with her benefit exception. Will continues this benefit exception once the request information is received.  Raina Mina, RN Care Management Coordinator [place] Office [phone]

## 2015-12-09 DIAGNOSIS — D0510 Intraductal carcinoma in situ of unspecified breast: Secondary | ICD-10-CM | POA: Diagnosis not present

## 2015-12-14 ENCOUNTER — Other Ambulatory Visit: Payer: Self-pay | Admitting: *Deleted

## 2015-12-14 NOTE — Patient Outreach (Signed)
[place] [facility]) Care Management  12/14/2015  Christina Logan 1965/09/29 [id number]   RN attempted once again to reach this pt however unsuccessful based upon the voice message left yesterday. Pt had inquired on what was needed and how to get the information requested to this RN case manager to proceed with her case.  RN left the requested information via pt's voice mail as requested to provide the needed paperwork concerning the expense of the compression sleeves she seeks reimbursement for at this time in detail. RN requested pt present to the [facility] office [facility]) for RN to view. Will continue to follow up accordingly based upon the request.  Raina Mina, RN Care Management Coordinator [place] Office [phone]

## 2015-12-14 NOTE — Patient Outreach (Addendum)
[place] [facility]) Care Management  12/14/2015  LAUREL HIRST 1966-03-04 [id number]   RN received a call form pt inquiring further on the benefit exception. RN requested additional information in order to proceed with her request for a benefit exception. Member has indicated she will scan and e-mail the requested information to this RN to proceed with her request. Will await information and update member accordingly with any response from leadership concerning her case. Will follow up with this member accordingly.  Raina Mina, RN Care Management Coordinator [place] Office [phone]

## 2015-12-14 NOTE — Patient Outreach (Signed)
[facility]) Care Management  12/13/2015  Christina Logan [birth year] [id number]   RN received a call from pt indicating she was recently on vacation and on how to get this RN the information needed to proceed with her benefit exception for reimbursement for arm compression sleeves.  RN will return the call the following business day concerning this inquiry.  Raina Mina, RN Care Management Coordinator [place] Office [phone]

## 2015-12-18 DIAGNOSIS — D0510 Intraductal carcinoma in situ of unspecified breast: Secondary | ICD-10-CM | POA: Diagnosis not present

## 2015-12-20 MED FILL — BUPROPION SR 150 MG TABLET: 150 | 90 days supply | Qty: 180 | Fill #1

## 2015-12-21 ENCOUNTER — Other Ambulatory Visit: Payer: Self-pay | Admitting: *Deleted

## 2015-12-21 NOTE — Patient Outreach (Signed)
[facility]) Care Management  12/21/2015  Christina Logan [birth year] [id number]  RN received the requested information to make the request for pt's benefit exception for reimbursement for a sleeve that was order for this pt. Request sent today on behalf of this pt for reimbursement. Will update pt accordingly base upon her request at that time.   Raina Mina, RN Care Management Coordinator [place] Office [phone]

## 2016-01-25 MED FILL — BUPROPION HCL SR 200 MG TAB: 200 | 90 days supply | Qty: 180 | Fill #0

## 2016-02-09 NOTE — H&P (Signed)
  4 months post op implant exchange, post bilateral NSM with expander based reconstruction. Desires larger size and more projection, more cleavage (wider base implants) and we are planning for implant exchange.  Patient with strong family history breast cancer, presented with right breast pain and screening MMG negative. Obtained MRI with NME within the UIQ of the right breast  2.4 x 2.0 x 2.1 cm. There was also a second area of NME laterally within the right breast at the 8:30- 9 o'clock position 2.5 x 2.0 x 1.4 cm in size. There was an irregular enhancing mass located within the right breast at the 3:30 o'clock position measuring 7 x 7 x 8 mm. Biopsy of UIQ and LOQ DCIS. Final pathology with multifocal DCIS, margins clear, SLN negative.   Genetic testing 2013 negative.   Prior 48 A/B cup, desires "full C."  Right mastectomy weight 270 g Left mastectomy 241 g.       Objective:   Physical Exam  Cardiovascular: Normal rate.   Pulmonary/Chest: Effort normal.  Abdominal: Soft.   Chest:  symmetric volume, shape Animation deformity bilateral BW R 13.5 L 13.5  Nipple to IMF R 7.5 L 7.5 cm  Assessment:     DCIS right breast Family history breast cancer S/p bilateral NSM, TE/ADM reconstruction S/p anatomic silicone implant exchange, lipofilling    Plan:  Have counseled that if she desires more projection and more volume recommend she switch to round implant. Discussed lower cohesivity may offer more softness but less upper pole maintenance projection, possible more rippling. We again reviewed implant examples of 4th generation, Inspira, Inspira Cohesive. She reports size and projection are main concerns not softness. Reviewed purpose of texturing and may benefit capsular contracture, prevent lateral displacement but with risk ALCL over lifetime.   Following discussion plan smooth round silicone cohesive gel. We discussed sizings and reviewed implant catalogues- based on base width  counseled I would select implants starting in low 500 ml range; she asked if we could "do 600." Counseled I can certainly order in this range as well and wound be appropriate with regards to chest width, given she wants dramatic change in size and cleavage. Reviewed increased size of implants may increase bottoming out or displacement over time  Plan fat grafting with harvest from flanks, reviewed variable take of graft, risk contour deformities abdomen, fat necrosis chest. Plan 4 weeks off work. Plan OP surgery, desires toradol dose before discharge.   Natrelle Style 410 MX 410 implants placed bilateral. Right breast 90 ml fat infiltrated and 100 ml of left breast.  Irene Limbo, MD [facility] Plastic & Reconstructive Surgery [phone], pin [phone]

## 2016-02-16 ENCOUNTER — Encounter (HOSPITAL_BASED_OUTPATIENT_CLINIC_OR_DEPARTMENT_OTHER): Payer: Self-pay | Admitting: *Deleted

## 2016-02-16 NOTE — Progress Notes (Signed)
Bring all medications. PT plans to come by tomorrow to pick up Boost drink.

## 2016-02-17 NOTE — Progress Notes (Signed)
Pt given 8 oz carton of boost breeze with written and verbal instructions to drink at 415 am morning of surgery, teach back, pt verbalized understanding, also to drink no other liquid pass midnight

## 2016-02-22 ENCOUNTER — Ambulatory Visit (HOSPITAL_BASED_OUTPATIENT_CLINIC_OR_DEPARTMENT_OTHER)
Admission: RE | Admit: 2016-02-22 | Discharge: 2016-02-22 | Disposition: A | Discharge status: Home / Self Care | Payer: 59 | Source: Ambulatory Visit | Attending: Plastic Surgery | Admitting: Plastic Surgery

## 2016-02-22 ENCOUNTER — Encounter (HOSPITAL_BASED_OUTPATIENT_CLINIC_OR_DEPARTMENT_OTHER)
Admission: RE | Disposition: A | Discharge status: Home / Self Care | Payer: Self-pay | Source: Ambulatory Visit | Attending: Plastic Surgery

## 2016-02-22 ENCOUNTER — Ambulatory Visit (HOSPITAL_BASED_OUTPATIENT_CLINIC_OR_DEPARTMENT_OTHER): Payer: 59 | Admitting: Anesthesiology

## 2016-02-22 ENCOUNTER — Encounter (HOSPITAL_BASED_OUTPATIENT_CLINIC_OR_DEPARTMENT_OTHER): Payer: Self-pay

## 2016-02-22 DIAGNOSIS — F418 Other specified anxiety disorders: Secondary | ICD-10-CM | POA: Diagnosis not present

## 2016-02-22 DIAGNOSIS — Z803 Family history of malignant neoplasm of breast: Secondary | ICD-10-CM | POA: Insufficient documentation

## 2016-02-22 DIAGNOSIS — Z421 Encounter for breast reconstruction following mastectomy: Secondary | ICD-10-CM | POA: Insufficient documentation

## 2016-02-22 DIAGNOSIS — Z8711 Personal history of peptic ulcer disease: Secondary | ICD-10-CM | POA: Insufficient documentation

## 2016-02-22 DIAGNOSIS — Z9013 Acquired absence of bilateral breasts and nipples: Secondary | ICD-10-CM | POA: Diagnosis not present

## 2016-02-22 DIAGNOSIS — Z853 Personal history of malignant neoplasm of breast: Secondary | ICD-10-CM | POA: Insufficient documentation

## 2016-02-22 DIAGNOSIS — F329 Major depressive disorder, single episode, unspecified: Secondary | ICD-10-CM | POA: Insufficient documentation

## 2016-02-22 HISTORY — PX: LIPOSUCTION WITH LIPOFILLING: SHX6436

## 2016-02-22 HISTORY — PX: BREAST IMPLANT EXCHANGE: SHX6296

## 2016-02-22 SURGERY — REPLACEMENT, IMPLANT, BREAST
Anesthesia: General | Site: Breast | Laterality: Bilateral

## 2016-02-22 MED ORDER — LIDOCAINE HCL (PF) 1 % IJ SOLN
INTRAMUSCULAR | Status: AC
  Filled 2016-02-22: qty 60

## 2016-02-22 MED ORDER — HYDROMORPHONE HCL 1 MG/ML IJ SOLN
INTRAMUSCULAR | Status: AC
  Filled 2016-02-22: qty 1

## 2016-02-22 MED ORDER — CEFAZOLIN SODIUM-DEXTROSE 2-4 GM/100ML-% IV SOLN
2.0000 g | INTRAVENOUS | Status: AC
  Administered 2016-02-22 (×2): 2 g via INTRAVENOUS

## 2016-02-22 MED ORDER — GABAPENTIN 300 MG PO CAPS
ORAL_CAPSULE | ORAL | Status: AC
  Filled 2016-02-22: qty 1

## 2016-02-22 MED ORDER — PROPOFOL 10 MG/ML IV BOLUS
INTRAVENOUS | Status: DC | PRN
  Administered 2016-02-22: 200 mg via INTRAVENOUS

## 2016-02-22 MED ORDER — MIDAZOLAM HCL 2 MG/2ML IJ SOLN
INTRAMUSCULAR | Status: AC
  Filled 2016-02-22: qty 2

## 2016-02-22 MED ORDER — FENTANYL CITRATE (PF) 100 MCG/2ML IJ SOLN
INTRAMUSCULAR | Status: AC
  Filled 2016-02-22: qty 4

## 2016-02-22 MED ORDER — OXYCODONE HCL 5 MG/5ML PO SOLN: 5.0000 mg | Freq: Once | ORAL | Status: DC | PRN

## 2016-02-22 MED ORDER — GABAPENTIN 300 MG PO CAPS
300.0000 mg | ORAL_CAPSULE | ORAL | Status: AC
  Administered 2016-02-22: 300 mg via ORAL

## 2016-02-22 MED ORDER — LIDOCAINE 2% (20 MG/ML) 5 ML SYRINGE
INTRAMUSCULAR | Status: AC
  Filled 2016-02-22: qty 5

## 2016-02-22 MED ORDER — SODIUM BICARBONATE 4 % IV SOLN
INTRAVENOUS | Status: AC
  Filled 2016-02-22: qty 10

## 2016-02-22 MED ORDER — GLYCOPYRROLATE 0.2 MG/ML IJ SOLN: 0.2000 mg | Freq: Once | INTRAMUSCULAR | Status: DC | PRN

## 2016-02-22 MED ORDER — PROPOFOL 10 MG/ML IV BOLUS
INTRAVENOUS | Status: AC
  Filled 2016-02-22: qty 40

## 2016-02-22 MED ORDER — POVIDONE-IODINE 10 % EX SOLN
CUTANEOUS | Status: DC | PRN
  Administered 2016-02-22: 1 via TOPICAL

## 2016-02-22 MED ORDER — SUCCINYLCHOLINE CHLORIDE 200 MG/10ML IV SOSY
PREFILLED_SYRINGE | INTRAVENOUS | Status: AC
  Filled 2016-02-22: qty 10

## 2016-02-22 MED ORDER — DEXAMETHASONE SODIUM PHOSPHATE 4 MG/ML IJ SOLN
INTRAMUSCULAR | Status: DC | PRN
  Administered 2016-02-22: 10 mg via INTRAVENOUS

## 2016-02-22 MED ORDER — ACETAMINOPHEN 500 MG PO TABS
1000.0000 mg | ORAL_TABLET | ORAL | Status: AC
  Administered 2016-02-22: 1000 mg via ORAL

## 2016-02-22 MED ORDER — SUCCINYLCHOLINE CHLORIDE 20 MG/ML IJ SOLN
INTRAMUSCULAR | Status: DC | PRN
  Administered 2016-02-22: 110 mg via INTRAVENOUS

## 2016-02-22 MED ORDER — EPINEPHRINE HCL 1 MG/ML IJ SOLN
INTRAMUSCULAR | Status: AC
  Filled 2016-02-22: qty 1

## 2016-02-22 MED ORDER — MIDAZOLAM HCL 2 MG/2ML IJ SOLN
1.0000 mg | INTRAMUSCULAR | Status: DC | PRN
  Administered 2016-02-22: 2 mg via INTRAVENOUS

## 2016-02-22 MED ORDER — ONDANSETRON HCL 4 MG/2ML IJ SOLN
INTRAMUSCULAR | Status: DC | PRN
  Administered 2016-02-22: 4 mg via INTRAVENOUS

## 2016-02-22 MED ORDER — LACTATED RINGERS IV SOLN
INTRAVENOUS | Status: DC
  Administered 2016-02-22 (×2): via INTRAVENOUS

## 2016-02-22 MED ORDER — ONDANSETRON HCL 4 MG/2ML IJ SOLN
INTRAMUSCULAR | Status: AC
  Filled 2016-02-22: qty 2

## 2016-02-22 MED ORDER — ACETAMINOPHEN 500 MG PO TABS
ORAL_TABLET | ORAL | Status: AC
  Filled 2016-02-22: qty 2

## 2016-02-22 MED ORDER — CEFAZOLIN SODIUM-DEXTROSE 2-4 GM/100ML-% IV SOLN
INTRAVENOUS | Status: AC
  Filled 2016-02-22: qty 100

## 2016-02-22 MED ORDER — FENTANYL CITRATE (PF) 100 MCG/2ML IJ SOLN
50.0000 ug | INTRAMUSCULAR | Status: AC | PRN
  Administered 2016-02-22: 100 ug via INTRAVENOUS
  Administered 2016-02-22: 75 ug via INTRAVENOUS
  Administered 2016-02-22: 25 ug via INTRAVENOUS

## 2016-02-22 MED ORDER — LACTATED RINGERS IV SOLN: INTRAVENOUS | Status: DC

## 2016-02-22 MED ORDER — KETOROLAC TROMETHAMINE 30 MG/ML IJ SOLN
INTRAMUSCULAR | Status: AC
  Filled 2016-02-22: qty 1

## 2016-02-22 MED ORDER — CHLORHEXIDINE GLUCONATE CLOTH 2 % EX PADS: 6.0000 | MEDICATED_PAD | Freq: Once | CUTANEOUS | Status: DC

## 2016-02-22 MED ORDER — SODIUM BICARBONATE 4 % IV SOLN
INTRAVENOUS | Status: DC | PRN
  Administered 2016-02-22: 400 mL via INTRAMUSCULAR

## 2016-02-22 MED ORDER — HYDROMORPHONE HCL 1 MG/ML IJ SOLN
0.2500 mg | INTRAMUSCULAR | Status: DC | PRN
  Administered 2016-02-22 (×4): 0.5 mg via INTRAVENOUS

## 2016-02-22 MED ORDER — SCOPOLAMINE 1 MG/3DAYS TD PT72: 1.0000 | MEDICATED_PATCH | Freq: Once | TRANSDERMAL | Status: DC | PRN

## 2016-02-22 MED ORDER — PROMETHAZINE HCL 25 MG/ML IJ SOLN
6.2500 mg | INTRAMUSCULAR | Status: DC | PRN
  Administered 2016-02-22: 6.25 mg via INTRAVENOUS

## 2016-02-22 MED ORDER — CIPROFLOXACIN HCL 500 MG PO TABS: 500.0000 mg | ORAL_TABLET | Freq: Two times a day (BID) | ORAL | 0 refills | Status: DC

## 2016-02-22 MED ORDER — LIDOCAINE 2% (20 MG/ML) 5 ML SYRINGE
INTRAMUSCULAR | Status: DC | PRN
  Administered 2016-02-22: 100 mg via INTRAVENOUS

## 2016-02-22 MED ORDER — GENTAMICIN SULFATE 40 MG/ML IJ SOLN
INTRAMUSCULAR | Status: DC | PRN
  Administered 2016-02-22: 1000 mL

## 2016-02-22 MED ORDER — OXYCODONE HCL 5 MG PO TABS: 5.0000 mg | ORAL_TABLET | Freq: Once | ORAL | Status: DC | PRN

## 2016-02-22 MED ORDER — PROMETHAZINE HCL 25 MG/ML IJ SOLN
INTRAMUSCULAR | Status: AC
  Filled 2016-02-22: qty 1

## 2016-02-22 MED ORDER — MEPERIDINE HCL 25 MG/ML IJ SOLN: 6.2500 mg | INTRAMUSCULAR | Status: DC | PRN

## 2016-02-22 MED ORDER — KETOROLAC TROMETHAMINE 30 MG/ML IJ SOLN
INTRAMUSCULAR | Status: DC | PRN
  Administered 2016-02-22: 30 mg via INTRAVENOUS

## 2016-02-22 MED ORDER — DEXAMETHASONE SODIUM PHOSPHATE 10 MG/ML IJ SOLN
INTRAMUSCULAR | Status: AC
  Filled 2016-02-22: qty 1

## 2016-02-22 MED FILL — CIPROFLOXACIN HCL 500 MG TA: 500 | 7 days supply | Qty: 14 | Fill #0

## 2016-02-22 SURGICAL SUPPLY — 87 items
BAG DECANTER FOR FLEXI CONT (MISCELLANEOUS) ×2 IMPLANT
BINDER ABDOMINAL 10 UNV 27-48 (MISCELLANEOUS) ×1 IMPLANT
BINDER ABDOMINAL 12 SM 30-45 (SOFTGOODS) IMPLANT
BINDER BREAST LRG (GAUZE/BANDAGES/DRESSINGS) IMPLANT
BINDER BREAST MEDIUM (GAUZE/BANDAGES/DRESSINGS) IMPLANT
BINDER BREAST XLRG (GAUZE/BANDAGES/DRESSINGS) ×1 IMPLANT
BINDER BREAST XXLRG (GAUZE/BANDAGES/DRESSINGS) IMPLANT
BLADE SURG 10 STRL SS (BLADE) ×2 IMPLANT
BLADE SURG 11 STRL SS (BLADE) ×2 IMPLANT
BNDG GAUZE ELAST 4 BULKY (GAUZE/BANDAGES/DRESSINGS) ×4 IMPLANT
CANISTER LIPO FAT HARVEST (MISCELLANEOUS) ×2 IMPLANT
CANISTER SUCT 1200ML W/VALVE (MISCELLANEOUS) ×2 IMPLANT
CHLORAPREP W/TINT 26ML (MISCELLANEOUS) ×3 IMPLANT
COMPRESSION GARMENT LG MOREWEL (MISCELLANEOUS) IMPLANT
COMPRESSION GARMENT MD MOREWEL (MISCELLANEOUS) IMPLANT
COMPRESSION GARMENT XL MOREWEL (MISCELLANEOUS) IMPLANT
COVER MAYO STAND STRL (DRAPES) ×3 IMPLANT
DECANTER SPIKE VIAL GLASS SM (MISCELLANEOUS) IMPLANT
DRAIN CHANNEL 15F RND FF W/TCR (WOUND CARE) ×2 IMPLANT
DRAPE IMP U-DRAPE 54X76 (DRAPES) IMPLANT
DRAPE INCISE IOBAN 66X45 STRL (DRAPES) IMPLANT
DRSG PAD ABDOMINAL 8X10 ST (GAUZE/BANDAGES/DRESSINGS) ×4 IMPLANT
ELECT BLADE 4.0 EZ CLEAN MEGAD (MISCELLANEOUS) ×2
ELECT COATED BLADE 2.86 ST (ELECTRODE) ×2 IMPLANT
ELECT REM PT RETURN 9FT ADLT (ELECTROSURGICAL) ×2
ELECTRODE BLDE 4.0 EZ CLN MEGD (MISCELLANEOUS) ×1 IMPLANT
ELECTRODE REM PT RTRN 9FT ADLT (ELECTROSURGICAL) ×1 IMPLANT
EVACUATOR SILICONE 100CC (DRAIN) IMPLANT
FILTER LIPOSUCTION (MISCELLANEOUS) ×2 IMPLANT
GLOVE BIO SURGEON STRL SZ 6 (GLOVE) ×5 IMPLANT
GLOVE BIO SURGEON STRL SZ7.5 (GLOVE) ×1 IMPLANT
GLOVE BIOGEL PI IND STRL 7.0 (GLOVE) IMPLANT
GLOVE BIOGEL PI IND STRL 7.5 (GLOVE) IMPLANT
GLOVE BIOGEL PI INDICATOR 7.0 (GLOVE) ×2
GLOVE BIOGEL PI INDICATOR 7.5 (GLOVE) ×1
GLOVE ECLIPSE 6.5 STRL STRAW (GLOVE) ×1 IMPLANT
GOWN STRL REUS W/ TWL LRG LVL3 (GOWN DISPOSABLE) ×2 IMPLANT
GOWN STRL REUS W/ TWL XL LVL3 (GOWN DISPOSABLE) IMPLANT
GOWN STRL REUS W/TWL LRG LVL3 (GOWN DISPOSABLE) ×4
GOWN STRL REUS W/TWL XL LVL3 (GOWN DISPOSABLE) ×2
IMPL BREAST 6.4X FULL 615 (Breast) IMPLANT
IMPL BRST 6.4X FULL 615CC (Breast) ×2 IMPLANT
IMPLANT BREAST GEL 615CC (Breast) ×4 IMPLANT
IV NS 500ML (IV SOLUTION)
IV NS 500ML BAXH (IV SOLUTION) ×1 IMPLANT
KIT FILL SYSTEM UNIVERSAL (SET/KITS/TRAYS/PACK) IMPLANT
LINER CANISTER 1000CC FLEX (MISCELLANEOUS) ×2 IMPLANT
LIQUID BAND (GAUZE/BANDAGES/DRESSINGS) ×4 IMPLANT
NDL HYPO 25X1 1.5 SAFETY (NEEDLE) IMPLANT
NDL SAFETY ECLIPSE 18X1.5 (NEEDLE) ×1 IMPLANT
NEEDLE HYPO 18GX1.5 SHARP (NEEDLE) ×2
NEEDLE HYPO 25X1 1.5 SAFETY (NEEDLE) IMPLANT
NS IRRIG 1000ML POUR BTL (IV SOLUTION) ×2 IMPLANT
PACK BASIN DAY SURGERY FS (CUSTOM PROCEDURE TRAY) ×2 IMPLANT
PACK UNIVERSAL I (CUSTOM PROCEDURE TRAY) ×2 IMPLANT
PAD ALCOHOL SWAB (MISCELLANEOUS) ×4 IMPLANT
PENCIL BUTTON HOLSTER BLD 10FT (ELECTRODE) ×2 IMPLANT
PIN SAFETY STERILE (MISCELLANEOUS) ×2 IMPLANT
SHEET MEDIUM DRAPE 40X70 STRL (DRAPES) ×4 IMPLANT
SIZER BREAST REUSE GEL 580CC (SIZER) ×2
SIZER BREAST REUSE XFP 615CC (SIZER) ×2
SIZER BRST REUSE GEL 580CC (SIZER) IMPLANT
SIZER BRST REUSE XFP 615CC (SIZER) IMPLANT
SLEEVE SCD COMPRESS KNEE MED (MISCELLANEOUS) ×2 IMPLANT
SPONGE LAP 18X18 X RAY DECT (DISPOSABLE) ×4 IMPLANT
STAPLER VISISTAT 35W (STAPLE) ×2 IMPLANT
SUT ETHILON 2 0 FS 18 (SUTURE) IMPLANT
SUT MNCRL AB 4-0 PS2 18 (SUTURE) ×3 IMPLANT
SUT PDS 3-0 CT2 (SUTURE)
SUT PDS AB 2-0 CT2 27 (SUTURE) ×1 IMPLANT
SUT PDS II 3-0 CT2 27 ABS (SUTURE) IMPLANT
SUT VIC AB 3-0 PS1 18 (SUTURE)
SUT VIC AB 3-0 PS1 18XBRD (SUTURE) IMPLANT
SUT VIC AB 3-0 SH 27 (SUTURE) ×4
SUT VIC AB 3-0 SH 27X BRD (SUTURE) ×1 IMPLANT
SUT VICRYL 4-0 PS2 18IN ABS (SUTURE) ×2 IMPLANT
SYR 50ML LL SCALE MARK (SYRINGE) ×6 IMPLANT
SYR BULB IRRIGATION 50ML (SYRINGE) ×2 IMPLANT
SYR CONTROL 10ML LL (SYRINGE) IMPLANT
SYR TB 1ML LL NO SAFETY (SYRINGE) ×2 IMPLANT
SYRINGE 10CC LL (SYRINGE) ×6 IMPLANT
TOWEL OR 17X24 6PK STRL BLUE (TOWEL DISPOSABLE) ×4 IMPLANT
TUBE CONNECTING 20X1/4 (TUBING) ×2 IMPLANT
TUBING INFILTRATION IT-10001 (TUBING) ×2 IMPLANT
TUBING SET GRADUATE ASPIR 12FT (MISCELLANEOUS) ×2 IMPLANT
UNDERPAD 30X30 (UNDERPADS AND DIAPERS) ×4 IMPLANT
YANKAUER SUCT BULB TIP NO VENT (SUCTIONS) ×2 IMPLANT

## 2016-02-22 NOTE — Anesthesia Postprocedure Evaluation (Signed)
Anesthesia Post Note  Patient: Christina Logan  Procedure(s) Performed: Procedure(s) (LRB): REVISION OF RECONSTRUCTIVE BILATERAL BREAST WITH SILCONE BREAST IMPLANT EXCHANGE (Bilateral) LIPOFILLING TO BILATERAL CHEST (Bilateral)  Patient location during evaluation: PACU Anesthesia Type: General Level of consciousness: awake and alert Pain management: pain level controlled Vital Signs Assessment: post-procedure vital signs reviewed and stable Respiratory status: spontaneous breathing, nonlabored ventilation, respiratory function stable and patient connected to nasal cannula oxygen Cardiovascular status: blood pressure returned to baseline and stable Postop Assessment: no signs of nausea or vomiting Anesthetic complications: no    Last Vitals:  Vitals:   02/22/16 1045 02/22/16 1100  BP: 114/78 116/90  Pulse: 63 62  Resp: 10 16  Temp:      Last Pain:  Vitals:   02/22/16 1100  TempSrc:   PainSc: [place]

## 2016-02-22 NOTE — Anesthesia Procedure Notes (Signed)
Procedure Name: Intubation Date/Time: 02/22/2016 7:38 AM Performed by: Bethena Roys T Pre-anesthesia Checklist: Patient identified, Emergency Drugs available, Suction available and Patient being monitored Patient Re-evaluated:Patient Re-evaluated prior to inductionOxygen Delivery Method: Circle system utilized Preoxygenation: Pre-oxygenation with 100% oxygen Intubation Type: IV induction Ventilation: Mask ventilation without difficulty Laryngoscope Size: Mac and 3 Grade View: Grade I Tube type: Oral Tube size: 7.0 mm Number of attempts: 1 Airway Equipment and Method: Stylet Placement Confirmation: ETT inserted through vocal cords under direct vision,  positive ETCO2 and breath sounds checked- equal and bilateral Secured at: 21 cm Tube secured with: Tape Dental Injury: Teeth and Oropharynx as per pre-operative assessment  Comments: Intubated by T BlockerCRNA

## 2016-02-22 NOTE — Interval H&P Note (Signed)
History and Physical Interval Note:  02/22/2016 6:55 AM  Christina Logan  has presented today for surgery, with the diagnosis of history of breast cancer, aquired absence of breast  The various methods of treatment have been discussed with the patient and family. After consideration of risks, benefits and other options for treatment, the patient has consented to  Procedure(s): REVISION OF RECONSTRUCTIVE BILATERAL BREAST WITH SILCONE BREAST IMPLANT EXCHANGE (Bilateral) POSSIBLE LIPOFILLING TO BILATERAL CHEST (Bilateral) as a surgical intervention .  The patient's history has been reviewed, patient examined, no change in status, stable for surgery.  I have reviewed the patient's chart and labs.  Questions were answered to the patient's satisfaction.     Antrice Pal

## 2016-02-22 NOTE — Op Note (Signed)
Operative Note   DATE OF OPERATION: 9.19.17  LOCATION: [facility]-outpatient  SURGICAL DIVISION: Plastic Surgery  PREOPERATIVE DIAGNOSES:  1. History breast cancer 2. Acquired absence breasts  POSTOPERATIVE DIAGNOSES:  same  PROCEDURE:  1. Revision to bilateral breast reconstruction with removal and replacement silicone implants 2. Lipofilling from abdomen to bilateral chest  SURGEON: Irene Limbo MD MBA  ASSISTANT: none  ANESTHESIA:  General.   EBL: 50 ml  COMPLICATIONS: None immediate.   INDICATIONS FOR PROCEDURE:  The patient, Christina Logan, is a 50 y.o. female born on [birth year], is here for silicone implant exchange. Patient has undergone bilateral NSM with two stage implant based reconstruction. She desires larger implants and plan additional lipofilling.   FINDINGS: Removed intact anatomic implants bilateral. Placed Inspira Smooth Round Extra Projection Cohesive 615 ml implants bilateral, REF SCX 615, RIGHT SN [id number] SN [id number]. 75 ml fat infiltrated over right chest, 80 ml of left chest.  DESCRIPTION OF PROCEDURE:  The patient's operative site was marked with the patient in the preoperative area to mark sternal notch, anterior axillary lines, chest midline. Bilateral flanks marked for donor site for fat grafting. The patient was taken to the operating room. SCDs were placed and IV antibiotics were given. The patient's operative site was prepped and draped in a sterile fashion. A time out was performed and all information was confirmed to be correct. Incision made in right IMF and carried to capsule. Implant removed. Capsulotomies performed at superiorly and medially. Additional anterior and inferior scoring of capsule completed. Sizer placed. I then directed attention to left chest. Incision made in IMF and carried to capsule. Implant removed and capsulotomies performed medially and superiorly. The IMF was set to chest wall bilateral with interrupted 2-0 PDS  from cut capsule margin to chest wall fascia. Sizer placed and patient brought to sitting position. SCX 615 ml implants chosen. Patient returned to supine position.   Stab incision made over bilateral lateral abdomen in prior scar and tumescent fluid infiltrated over bilateral flanks, total 400 ml tumescent infiltrated. Power assisted liposuction performed to endpoint symmetric contour and soft tissue thickness. The fat was then washed and prepared by gravity for infiltration. Harvested fat was then infiltrated in subcutaneous and intramuscular planes over throughout mastectomy flaps including over area of visible animation pectoralis marked preoperatively.   At this time breast cavities irrigated with solution containing Ancef, gentamicin, and bacitracin, followed by Betadine. Implant placed in left breast cavity. Care taken to ensure proper orientation. Closure was completed with running 3-0 vicryl for approximation of capsule and superficial fascia. 4-0 vicryl was placed in dermis and running 4-0 monocryl was used to close skin.Similar closure performed on right breast following implant placement.  Tissue adhesive applied to breast incisions. The abdominal incisions were approximated with 4-0 monocryl. Dry dressing and abdominal and breast binders applied.   The patient was allowed to wake from anesthesia, extubated and taken to the recovery room in satisfactory condition.   SPECIMENS:none  DRAINS: none  Irene Limbo, MD [facility] Plastic & Reconstructive Surgery [phone], pin [phone]

## 2016-02-22 NOTE — Transfer of Care (Signed)
Immediate Anesthesia Transfer of Care Note  Patient: Christina Logan  Procedure(s) Performed: Procedure(s): REVISION OF RECONSTRUCTIVE BILATERAL BREAST WITH SILCONE BREAST IMPLANT EXCHANGE (Bilateral) LIPOFILLING TO BILATERAL CHEST (Bilateral)  Patient Location: PACU  Anesthesia Type:General  Level of Consciousness: awake and oriented  Airway & Oxygen Therapy: Patient Spontanous Breathing, o2 per mask  Post-op Assessment: Report given to RN and Post -op Vital signs reviewed and stable  Post vital signs: Reviewed and stable  Last Vitals: 127/96, 77, 12, 100%, 97.5 Vitals:   02/22/16 0645  BP: 106/72  Pulse: 83  Resp: 18  Temp: 36.8 C    Last Pain:  Vitals:   02/22/16 0645  TempSrc: Oral         Complications: No apparent anesthesia complications

## 2016-02-22 NOTE — Anesthesia Preprocedure Evaluation (Addendum)
Anesthesia Evaluation  Patient identified by MRN, date of birth, ID band Patient awake    Reviewed: Allergy & Precautions, NPO status , Patient's Chart, lab work & pertinent test results  Airway Mallampati: I  TM Distance: >3 FB Neck ROM: Full    Dental  (+) Teeth Intact, Dental Advisory Given   Pulmonary    breath sounds clear to auscultation       Cardiovascular  Rhythm:Regular Rate:Normal     Neuro/Psych PSYCHIATRIC DISORDERS Anxiety Depression    GI/Hepatic Neg liver ROS, PUD,   Endo/Other  negative endocrine ROS  Renal/GU negative Renal ROS  negative genitourinary   Musculoskeletal negative musculoskeletal ROS (+)   Abdominal Normal abdominal exam  (+)   Peds negative pediatric ROS (+)  Hematology negative hematology ROS (+)   Anesthesia Other Findings   Reproductive/Obstetrics negative OB ROS                            Anesthesia Physical Anesthesia Plan  ASA: II  Anesthesia Plan: General   Post-op Pain Management:    Induction: Intravenous  Airway Management Planned: Oral ETT  Additional Equipment:   Intra-op Plan:   Post-operative Plan: Extubation in OR  Informed Consent: I have reviewed the patients History and Physical, chart, labs and discussed the procedure including the risks, benefits and alternatives for the proposed anesthesia with the patient or authorized representative who has indicated his/her understanding and acceptance.   Dental advisory given  Plan Discussed with: CRNA  Anesthesia Plan Comments:         Anesthesia Quick Evaluation

## 2016-02-22 NOTE — Discharge Instructions (Signed)

## 2016-02-24 ENCOUNTER — Encounter (HOSPITAL_BASED_OUTPATIENT_CLINIC_OR_DEPARTMENT_OTHER): Payer: Self-pay | Admitting: Plastic Surgery

## 2016-03-02 ENCOUNTER — Other Ambulatory Visit: Payer: Self-pay | Admitting: *Deleted

## 2016-03-02 NOTE — Patient Outreach (Signed)
[facility]) Care Management  03/02/2016  Christina Logan 1966-04-13 [id number]   RN spoke with pt today and received an update that pt has submitted the requested paperwork and was once again declined for coverage and/or reimbursement. Will continues to assist with based upon instructions received via administration on this claim however at this time the claim has been declined twice. Will remains active until resolution can be verified. Member with understanding.  Raina Mina, RN Care Management Coordinator [place] Office [phone]

## 2016-03-03 MED FILL — VALACYCLOVIR HCL 500 MG TAB: 500 | 5 days supply | Qty: 10 | Fill #2

## 2016-04-03 MED FILL — clonazePAM 0.5 MG TABS: 0.5 | 10 days supply | Qty: 10 | Fill #1

## 2016-04-05 ENCOUNTER — Other Ambulatory Visit: Payer: Self-pay | Admitting: *Deleted

## 2016-04-05 NOTE — Patient Outreach (Signed)
[facility]) Care Management  04/05/2016  Christina Logan [birth year] [id number]   RN outreached to pt today however unsuccessful. RN able to leave a HIPAA approved voice message requesting a call back. Will inquire further on pt's resolution concerning her previous benefit exception.  Will continue another attempt prior to case closure protocol.  Raina Mina, RN Care Management Coordinator [place] Office [phone]

## 2016-04-10 DIAGNOSIS — J029 Acute pharyngitis, unspecified: Secondary | ICD-10-CM | POA: Diagnosis not present

## 2016-04-19 MED FILL — BUPROPION HCL SR 200 MG TAB: 200 | 90 days supply | Qty: 180 | Fill #1

## 2016-05-03 ENCOUNTER — Other Ambulatory Visit: Payer: Self-pay | Admitting: *Deleted

## 2016-05-03 NOTE — Patient Outreach (Signed)
[facility]) Care Management  05/03/2016  TABER SANKEY 1965/09/08 [id number]   Case will be closed as pt working directly with Alcide Clever the [place] Specialist.  Raina Mina, RN Care Management Coordinator [place] Office [phone]

## 2016-06-06 DIAGNOSIS — K5190 Ulcerative colitis, unspecified, without complications: Secondary | ICD-10-CM | POA: Diagnosis not present

## 2016-07-14 DIAGNOSIS — D0510 Intraductal carcinoma in situ of unspecified breast: Secondary | ICD-10-CM | POA: Diagnosis not present

## 2016-07-26 MED FILL — BUPROPION HCL SR 200 MG TAB: 200 | 90 days supply | Qty: 180 | Fill #0

## 2016-09-07 DIAGNOSIS — M2022 Hallux rigidus, left foot: Secondary | ICD-10-CM | POA: Diagnosis not present

## 2016-09-08 MED FILL — clonazePAM 0.5 MG TABS: 0.5 | 10 days supply | Qty: 10 | Fill #0

## 2016-09-11 DIAGNOSIS — Z853 Personal history of malignant neoplasm of breast: Secondary | ICD-10-CM | POA: Diagnosis not present

## 2016-09-11 DIAGNOSIS — Z9013 Acquired absence of bilateral breasts and nipples: Secondary | ICD-10-CM | POA: Diagnosis not present

## 2016-10-25 MED FILL — BUPROPION HCL SR 200 MG TAB: 200 | 90 days supply | Qty: 180 | Fill #1

## 2016-11-20 DIAGNOSIS — R1012 Left upper quadrant pain: Secondary | ICD-10-CM | POA: Diagnosis not present

## 2016-11-20 DIAGNOSIS — K5900 Constipation, unspecified: Secondary | ICD-10-CM | POA: Diagnosis not present

## 2016-11-20 DIAGNOSIS — K5150 Left sided colitis without complications: Secondary | ICD-10-CM | POA: Diagnosis not present

## 2016-11-21 ENCOUNTER — Other Ambulatory Visit: Payer: Self-pay | Admitting: Physician Assistant

## 2016-11-21 DIAGNOSIS — R1012 Left upper quadrant pain: Secondary | ICD-10-CM

## 2016-11-21 DIAGNOSIS — R748 Abnormal levels of other serum enzymes: Secondary | ICD-10-CM

## 2016-11-22 ENCOUNTER — Ambulatory Visit
Admission: RE | Admit: 2016-11-22 | Discharge: 2016-11-22 | Disposition: A | Discharge status: Home / Self Care | Payer: Commercial Managed Care - PPO | Source: Ambulatory Visit | Attending: Physician Assistant | Admitting: Physician Assistant

## 2016-11-22 DIAGNOSIS — R1012 Left upper quadrant pain: Secondary | ICD-10-CM

## 2016-11-22 DIAGNOSIS — R109 Unspecified abdominal pain: Secondary | ICD-10-CM | POA: Diagnosis not present

## 2016-11-22 DIAGNOSIS — R748 Abnormal levels of other serum enzymes: Secondary | ICD-10-CM

## 2016-11-22 MED ORDER — IOPAMIDOL (ISOVUE-300) INJECTION 61%
100.0000 mL | Freq: Once | INTRAVENOUS | Status: AC | PRN
  Administered 2016-11-22: 100 mL via INTRAVENOUS

## 2016-11-24 ENCOUNTER — Other Ambulatory Visit: Payer: Self-pay | Admitting: Physician Assistant

## 2016-11-24 DIAGNOSIS — R748 Abnormal levels of other serum enzymes: Secondary | ICD-10-CM | POA: Diagnosis not present

## 2016-11-24 DIAGNOSIS — K769 Liver disease, unspecified: Secondary | ICD-10-CM

## 2016-11-24 DIAGNOSIS — R1012 Left upper quadrant pain: Secondary | ICD-10-CM | POA: Diagnosis not present

## 2016-11-30 DIAGNOSIS — Z9013 Acquired absence of bilateral breasts and nipples: Secondary | ICD-10-CM | POA: Diagnosis not present

## 2016-11-30 DIAGNOSIS — Z853 Personal history of malignant neoplasm of breast: Secondary | ICD-10-CM | POA: Diagnosis not present

## 2016-11-30 MED FILL — GAVILYTE-N SOLUTION: 420 | 1 days supply | Qty: 4000 | Fill #0

## 2016-12-04 DIAGNOSIS — K5730 Diverticulosis of large intestine without perforation or abscess without bleeding: Secondary | ICD-10-CM | POA: Diagnosis not present

## 2016-12-04 DIAGNOSIS — K5100 Ulcerative (chronic) pancolitis without complications: Secondary | ICD-10-CM | POA: Diagnosis not present

## 2016-12-11 DIAGNOSIS — Z01419 Encounter for gynecological examination (general) (routine) without abnormal findings: Secondary | ICD-10-CM | POA: Diagnosis not present

## 2016-12-11 DIAGNOSIS — Z6824 Body mass index (BMI) 24.0-24.9, adult: Secondary | ICD-10-CM | POA: Diagnosis not present

## 2016-12-13 ENCOUNTER — Ambulatory Visit
Admission: RE | Admit: 2016-12-13 | Discharge: 2016-12-13 | Disposition: A | Discharge status: Home / Self Care | Payer: Commercial Managed Care - PPO | Source: Ambulatory Visit | Attending: Physician Assistant | Admitting: Physician Assistant

## 2016-12-13 DIAGNOSIS — K7689 Other specified diseases of liver: Secondary | ICD-10-CM | POA: Diagnosis not present

## 2016-12-13 DIAGNOSIS — K769 Liver disease, unspecified: Secondary | ICD-10-CM

## 2016-12-13 MED ORDER — GADOBENATE DIMEGLUMINE 529 MG/ML IV SOLN
15.0000 mL | Freq: Once | INTRAVENOUS | Status: AC | PRN
  Administered 2016-12-13: 14 mL via INTRAVENOUS

## 2016-12-18 DIAGNOSIS — L821 Other seborrheic keratosis: Secondary | ICD-10-CM | POA: Diagnosis not present

## 2016-12-18 DIAGNOSIS — L718 Other rosacea: Secondary | ICD-10-CM | POA: Diagnosis not present

## 2016-12-20 DIAGNOSIS — K5190 Ulcerative colitis, unspecified, without complications: Secondary | ICD-10-CM | POA: Diagnosis not present

## 2016-12-20 DIAGNOSIS — Z0000 Encounter for general adult medical examination without abnormal findings: Secondary | ICD-10-CM | POA: Diagnosis not present

## 2016-12-21 DIAGNOSIS — Z23 Encounter for immunization: Secondary | ICD-10-CM | POA: Diagnosis not present

## 2016-12-27 MED FILL — clonazePAM 0.5 MG TABS: 0.5 | 10 days supply | Qty: 10 | Fill #0

## 2017-01-08 MED FILL — VALACYCLOVIR HCL 500 MG TAB: 500 | 5 days supply | Qty: 10 | Fill #0

## 2017-01-24 MED FILL — BUPROPION HCL SR 200 MG TAB: 200 | 90 days supply | Qty: 180 | Fill #0

## 2017-02-01 DIAGNOSIS — Z9013 Acquired absence of bilateral breasts and nipples: Secondary | ICD-10-CM | POA: Diagnosis not present

## 2017-02-01 DIAGNOSIS — Z853 Personal history of malignant neoplasm of breast: Secondary | ICD-10-CM | POA: Diagnosis not present

## 2017-03-19 DIAGNOSIS — B078 Other viral warts: Secondary | ICD-10-CM | POA: Diagnosis not present

## 2017-03-19 DIAGNOSIS — D485 Neoplasm of uncertain behavior of skin: Secondary | ICD-10-CM | POA: Diagnosis not present

## 2017-03-22 DIAGNOSIS — Z853 Personal history of malignant neoplasm of breast: Secondary | ICD-10-CM | POA: Diagnosis not present

## 2017-03-22 DIAGNOSIS — Z9013 Acquired absence of bilateral breasts and nipples: Secondary | ICD-10-CM | POA: Diagnosis not present

## 2017-03-22 NOTE — H&P (Signed)
Subjective:     Patient ID: Christina Logan is a 51 y.o. female.  Follow-up   1 year post revision with change to round implants. Here for further discussion prior to planned revision.  Patient with strong family history breast cancer, presented with right breast pain and screening MMG negative. Obtained MRI with NME within the UIQ of the right breast  2.4 x 2.0 x 2.1 cm. There was also a second area of NME laterally within the right breast at the 8:30- 9 o'clock position 2.5 x 2.0 x 1.4 cm in size. There was an irregular enhancing mass located within the right breast at the 3:30 o'clock position measuring 7 x 7 x 8 mm. Biopsy of UIQ and LOQ DCIS. Final pathology with multifocal DCIS, margins clear, SLN negative.   Genetic testing 2013 negative.   Prior 38 A/B cup, current   Right mastectomy weight 270 g Left mastectomy 241 g.   Review of Systems     Objective:   Physical Exam  Cardiovascular: Normal rate, regular rhythm and normal heart sounds.   Pulmonary/Chest: Effort normal and breath sounds normal.   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 Reviewed options observation vs surgery for silicone implant exchange, and acellular dermis for reinforcement. Counseled the AP flip of implant is always possible but will likely continue to occur if implant pocket is not tight. Happy with size, plan keep same implant volume and projection. We discussed options to change from cohesive gel to capacity filled- AP flip is still a possibility with this but may be less  noticeable.Risk of this may be less upper pole fullness and rippling visible. Another alternative is change to textured device- we have discussed ALCL risk in past with textured and she does not desire this. Plan to continue with same cohesive gel smooth implants.  Plan OP surgery, drains.   Inspira Smooth Round Extra Projection Cohesive 615 ml implants bilateral. 75 ml fat infiltrated over right chest, 80 ml of left chest.     Irene Limbo, MD [facility] Plastic & Reconstructive Surgery [phone], pin [phone]

## 2017-03-26 ENCOUNTER — Encounter (HOSPITAL_BASED_OUTPATIENT_CLINIC_OR_DEPARTMENT_OTHER): Payer: Self-pay | Admitting: *Deleted

## 2017-04-02 NOTE — Progress Notes (Signed)
Pt in to pick up pre op drink, instructions reviewed.

## 2017-04-03 ENCOUNTER — Encounter (HOSPITAL_BASED_OUTPATIENT_CLINIC_OR_DEPARTMENT_OTHER): Payer: Self-pay

## 2017-04-03 ENCOUNTER — Ambulatory Visit (HOSPITAL_BASED_OUTPATIENT_CLINIC_OR_DEPARTMENT_OTHER): Payer: Commercial Managed Care - PPO | Admitting: Anesthesiology

## 2017-04-03 ENCOUNTER — Ambulatory Visit (HOSPITAL_BASED_OUTPATIENT_CLINIC_OR_DEPARTMENT_OTHER)
Admission: RE | Admit: 2017-04-03 | Discharge: 2017-04-03 | Disposition: A | Discharge status: Home / Self Care | Payer: Commercial Managed Care - PPO | Source: Ambulatory Visit | Attending: Plastic Surgery | Admitting: Plastic Surgery

## 2017-04-03 ENCOUNTER — Encounter (HOSPITAL_BASED_OUTPATIENT_CLINIC_OR_DEPARTMENT_OTHER)
Admission: RE | Disposition: A | Discharge status: Home / Self Care | Payer: Self-pay | Source: Ambulatory Visit | Attending: Plastic Surgery

## 2017-04-03 DIAGNOSIS — Z9013 Acquired absence of bilateral breasts and nipples: Secondary | ICD-10-CM | POA: Insufficient documentation

## 2017-04-03 DIAGNOSIS — Z853 Personal history of malignant neoplasm of breast: Secondary | ICD-10-CM | POA: Diagnosis not present

## 2017-04-03 DIAGNOSIS — T8542XA Displacement of breast prosthesis and implant, initial encounter: Secondary | ICD-10-CM | POA: Insufficient documentation

## 2017-04-03 DIAGNOSIS — Z803 Family history of malignant neoplasm of breast: Secondary | ICD-10-CM | POA: Diagnosis not present

## 2017-04-03 DIAGNOSIS — Y831 Surgical operation with implant of artificial internal device as the cause of abnormal reaction of the patient, or of later complication, without mention of misadventure at the time of the procedure: Secondary | ICD-10-CM | POA: Insufficient documentation

## 2017-04-03 DIAGNOSIS — N651 Disproportion of reconstructed breast: Secondary | ICD-10-CM | POA: Insufficient documentation

## 2017-04-03 DIAGNOSIS — Z421 Encounter for breast reconstruction following mastectomy: Secondary | ICD-10-CM | POA: Diagnosis not present

## 2017-04-03 HISTORY — PX: BREAST IMPLANT EXCHANGE: SHX6296

## 2017-04-03 SURGERY — REPLACEMENT, IMPLANT, BREAST
Anesthesia: General | Site: Breast | Laterality: Bilateral

## 2017-04-03 MED ORDER — GABAPENTIN 300 MG PO CAPS
300.0000 mg | ORAL_CAPSULE | ORAL | Status: AC
  Administered 2017-04-03: 300 mg via ORAL

## 2017-04-03 MED ORDER — SUGAMMADEX SODIUM 200 MG/2ML IV SOLN
INTRAVENOUS | Status: AC
Start: 2017-04-03 — End: ?
  Filled 2017-04-03: qty 2

## 2017-04-03 MED ORDER — CHLORHEXIDINE GLUCONATE CLOTH 2 % EX PADS: 6.0000 | MEDICATED_PAD | Freq: Once | CUTANEOUS | Status: DC

## 2017-04-03 MED ORDER — FENTANYL CITRATE (PF) 100 MCG/2ML IJ SOLN
INTRAMUSCULAR | Status: AC
  Filled 2017-04-03: qty 2

## 2017-04-03 MED ORDER — CELECOXIB 200 MG PO CAPS
ORAL_CAPSULE | ORAL | Status: AC
  Filled 2017-04-03: qty 1

## 2017-04-03 MED ORDER — FENTANYL CITRATE (PF) 100 MCG/2ML IJ SOLN
INTRAMUSCULAR | Status: DC | PRN
  Administered 2017-04-03 (×4): 50 ug via INTRAVENOUS

## 2017-04-03 MED ORDER — DOXYCYCLINE HYCLATE 50 MG PO CAPS
50.0000 mg | ORAL_CAPSULE | Freq: Two times a day (BID) | ORAL | 0 refills | Status: DC
Start: 2017-04-03 — End: 2022-09-06

## 2017-04-03 MED ORDER — PROPOFOL 10 MG/ML IV BOLUS
INTRAVENOUS | Status: AC
  Filled 2017-04-03: qty 40

## 2017-04-03 MED ORDER — HYDROMORPHONE HCL 1 MG/ML IJ SOLN: 0.2500 mg | INTRAMUSCULAR | Status: DC | PRN

## 2017-04-03 MED ORDER — PROPOFOL 10 MG/ML IV BOLUS
INTRAVENOUS | Status: DC | PRN
  Administered 2017-04-03: 160 mg via INTRAVENOUS

## 2017-04-03 MED ORDER — DEXAMETHASONE SODIUM PHOSPHATE 10 MG/ML IJ SOLN
INTRAMUSCULAR | Status: DC | PRN
  Administered 2017-04-03: 10 mg via INTRAVENOUS

## 2017-04-03 MED ORDER — ONDANSETRON HCL 4 MG/2ML IJ SOLN
INTRAMUSCULAR | Status: AC
  Filled 2017-04-03: qty 2

## 2017-04-03 MED ORDER — LIDOCAINE 2% (20 MG/ML) 5 ML SYRINGE
INTRAMUSCULAR | Status: DC | PRN
  Administered 2017-04-03: 60 mg via INTRAVENOUS

## 2017-04-03 MED ORDER — PROMETHAZINE HCL 25 MG/ML IJ SOLN: 6.2500 mg | INTRAMUSCULAR | Status: DC | PRN

## 2017-04-03 MED ORDER — SODIUM CHLORIDE 0.9 % IV SOLN
INTRAVENOUS | Status: DC | PRN
  Administered 2017-04-03: 08:00:00

## 2017-04-03 MED ORDER — 0.9 % SODIUM CHLORIDE (POUR BTL) OPTIME
TOPICAL | Status: DC | PRN
  Administered 2017-04-03: 1000 mL

## 2017-04-03 MED ORDER — SUGAMMADEX SODIUM 200 MG/2ML IV SOLN
INTRAVENOUS | Status: DC | PRN
  Administered 2017-04-03: 200 mg via INTRAVENOUS

## 2017-04-03 MED ORDER — KETOROLAC TROMETHAMINE 30 MG/ML IJ SOLN
INTRAMUSCULAR | Status: AC
  Filled 2017-04-03: qty 1

## 2017-04-03 MED ORDER — CEFAZOLIN SODIUM-DEXTROSE 2-4 GM/100ML-% IV SOLN
2.0000 g | INTRAVENOUS | Status: AC
  Administered 2017-04-03: 2 g via INTRAVENOUS

## 2017-04-03 MED ORDER — ROCURONIUM BROMIDE 50 MG/5ML IV SOSY
PREFILLED_SYRINGE | INTRAVENOUS | Status: DC | PRN
  Administered 2017-04-03: 30 mg via INTRAVENOUS

## 2017-04-03 MED ORDER — GABAPENTIN 300 MG PO CAPS
ORAL_CAPSULE | ORAL | Status: AC
  Filled 2017-04-03: qty 1

## 2017-04-03 MED ORDER — MIDAZOLAM HCL 2 MG/2ML IJ SOLN
INTRAMUSCULAR | Status: AC
  Filled 2017-04-03: qty 2

## 2017-04-03 MED ORDER — KETOROLAC TROMETHAMINE 30 MG/ML IJ SOLN
30.0000 mg | Freq: Once | INTRAMUSCULAR | Status: AC
  Administered 2017-04-03: 30 mg via INTRAVENOUS

## 2017-04-03 MED ORDER — OXYCODONE HCL 5 MG PO TABS: 5.0000 mg | ORAL_TABLET | ORAL | 0 refills | Status: AC | PRN

## 2017-04-03 MED ORDER — LACTATED RINGERS IV SOLN: INTRAVENOUS | Status: DC

## 2017-04-03 MED ORDER — OXYCODONE HCL 5 MG/5ML PO SOLN: 5.0000 mg | Freq: Once | ORAL | Status: AC | PRN

## 2017-04-03 MED ORDER — CELECOXIB 200 MG PO CAPS
200.0000 mg | ORAL_CAPSULE | ORAL | Status: AC
  Administered 2017-04-03: 200 mg via ORAL

## 2017-04-03 MED ORDER — MIDAZOLAM HCL 2 MG/2ML IJ SOLN
INTRAMUSCULAR | Status: DC | PRN
  Administered 2017-04-03: 2 mg via INTRAVENOUS

## 2017-04-03 MED ORDER — SCOPOLAMINE 1 MG/3DAYS TD PT72: 1.0000 | MEDICATED_PATCH | Freq: Once | TRANSDERMAL | Status: DC | PRN

## 2017-04-03 MED ORDER — PHENYLEPHRINE 40 MCG/ML (10ML) SYRINGE FOR IV PUSH (FOR BLOOD PRESSURE SUPPORT)
PREFILLED_SYRINGE | INTRAVENOUS | Status: DC | PRN
  Administered 2017-04-03 (×3): 80 ug via INTRAVENOUS

## 2017-04-03 MED ORDER — PHENYLEPHRINE 40 MCG/ML (10ML) SYRINGE FOR IV PUSH (FOR BLOOD PRESSURE SUPPORT)
PREFILLED_SYRINGE | INTRAVENOUS | Status: AC
  Filled 2017-04-03: qty 10

## 2017-04-03 MED ORDER — OXYCODONE HCL 5 MG PO TABS
ORAL_TABLET | ORAL | Status: AC
  Filled 2017-04-03: qty 1

## 2017-04-03 MED ORDER — DEXAMETHASONE SODIUM PHOSPHATE 10 MG/ML IJ SOLN
INTRAMUSCULAR | Status: AC
  Filled 2017-04-03: qty 1

## 2017-04-03 MED ORDER — FENTANYL CITRATE (PF) 100 MCG/2ML IJ SOLN: 50.0000 ug | INTRAMUSCULAR | Status: DC | PRN

## 2017-04-03 MED ORDER — ACETAMINOPHEN 500 MG PO TABS
ORAL_TABLET | ORAL | Status: AC
  Filled 2017-04-03: qty 2

## 2017-04-03 MED ORDER — ROCURONIUM BROMIDE 10 MG/ML (PF) SYRINGE
PREFILLED_SYRINGE | INTRAVENOUS | Status: AC
Start: 2017-04-03 — End: ?
  Filled 2017-04-03: qty 5

## 2017-04-03 MED ORDER — ACETAMINOPHEN 500 MG PO TABS
1000.0000 mg | ORAL_TABLET | ORAL | Status: AC
  Administered 2017-04-03: 1000 mg via ORAL

## 2017-04-03 MED ORDER — LACTATED RINGERS IV SOLN
INTRAVENOUS | Status: DC | PRN
  Administered 2017-04-03 (×2): via INTRAVENOUS

## 2017-04-03 MED ORDER — CEFAZOLIN SODIUM-DEXTROSE 2-4 GM/100ML-% IV SOLN
INTRAVENOUS | Status: AC
  Filled 2017-04-03: qty 100

## 2017-04-03 MED ORDER — OXYCODONE HCL 5 MG PO TABS
5.0000 mg | ORAL_TABLET | Freq: Once | ORAL | Status: AC | PRN
  Administered 2017-04-03: 5 mg via ORAL

## 2017-04-03 MED ORDER — LIDOCAINE 2% (20 MG/ML) 5 ML SYRINGE
INTRAMUSCULAR | Status: AC
  Filled 2017-04-03: qty 5

## 2017-04-03 MED ORDER — ONDANSETRON HCL 4 MG/2ML IJ SOLN
INTRAMUSCULAR | Status: DC | PRN
  Administered 2017-04-03: 4 mg via INTRAVENOUS

## 2017-04-03 MED ORDER — MIDAZOLAM HCL 2 MG/2ML IJ SOLN: 1.0000 mg | INTRAMUSCULAR | Status: DC | PRN

## 2017-04-03 MED FILL — DOXYCYCLINE HYC 50 MG CAP: 50 | 7 days supply | Qty: 14 | Fill #0

## 2017-04-03 MED FILL — oxyCODONE HCL 5 MG TABS: 5 | 2 days supply | Qty: 10 | Fill #0

## 2017-04-03 SURGICAL SUPPLY — 72 items
ADH SKN CLS APL DERMABOND .7 (GAUZE/BANDAGES/DRESSINGS) ×2
ALLODERM 8X16 MED THICK (Tissue) ×6 IMPLANT
BAG DECANTER FOR FLEXI CONT (MISCELLANEOUS) ×3 IMPLANT
BINDER BREAST LRG (GAUZE/BANDAGES/DRESSINGS) IMPLANT
BINDER BREAST MEDIUM (GAUZE/BANDAGES/DRESSINGS) IMPLANT
BINDER BREAST XLRG (GAUZE/BANDAGES/DRESSINGS) ×2 IMPLANT
BINDER BREAST XXLRG (GAUZE/BANDAGES/DRESSINGS) IMPLANT
BLADE SURG 10 STRL SS (BLADE) ×5 IMPLANT
BNDG GAUZE ELAST 4 BULKY (GAUZE/BANDAGES/DRESSINGS) ×2 IMPLANT
CANISTER SUCT 1200ML W/VALVE (MISCELLANEOUS) ×5 IMPLANT
CHLORAPREP W/TINT 26ML (MISCELLANEOUS) ×5 IMPLANT
COVER BACK TABLE 60X90IN (DRAPES) ×3 IMPLANT
COVER MAYO STAND STRL (DRAPES) ×3 IMPLANT
DECANTER SPIKE VIAL GLASS SM (MISCELLANEOUS) IMPLANT
DERMABOND ADVANCED (GAUZE/BANDAGES/DRESSINGS) ×4
DERMABOND ADVANCED .7 DNX12 (GAUZE/BANDAGES/DRESSINGS) ×1 IMPLANT
DRAIN CHANNEL 15F RND FF W/TCR (WOUND CARE) ×5 IMPLANT
DRAPE INCISE IOBAN 66X45 STRL (DRAPES) IMPLANT
DRAPE TOP ARMCOVERS (MISCELLANEOUS) ×3 IMPLANT
DRAPE U-SHAPE 76X120 STRL (DRAPES) ×3 IMPLANT
DRAPE UTILITY XL STRL (DRAPES) IMPLANT
DRSG PAD ABDOMINAL 8X10 ST (GAUZE/BANDAGES/DRESSINGS) ×6 IMPLANT
ELECT BLADE 4.0 EZ CLEAN MEGAD (MISCELLANEOUS) ×3
ELECT COATED BLADE 2.86 ST (ELECTRODE) ×3 IMPLANT
ELECT REM PT RETURN 9FT ADLT (ELECTROSURGICAL) ×3
ELECTRODE BLDE 4.0 EZ CLN MEGD (MISCELLANEOUS) ×1 IMPLANT
ELECTRODE REM PT RTRN 9FT ADLT (ELECTROSURGICAL) ×1 IMPLANT
EVACUATOR SILICONE 100CC (DRAIN) ×4 IMPLANT
GLOVE BIO SURGEON STRL SZ 6 (GLOVE) ×7 IMPLANT
GLOVE BIOGEL PI IND STRL 7.0 (GLOVE) IMPLANT
GLOVE BIOGEL PI IND STRL 7.5 (GLOVE) IMPLANT
GLOVE BIOGEL PI INDICATOR 7.0 (GLOVE) ×2
GLOVE BIOGEL PI INDICATOR 7.5 (GLOVE) ×2
GLOVE SURG SS PI 6.5 STRL IVOR (GLOVE) ×4 IMPLANT
GOWN STRL REUS W/ TWL LRG LVL3 (GOWN DISPOSABLE) ×2 IMPLANT
GOWN STRL REUS W/TWL LRG LVL3 (GOWN DISPOSABLE) ×6
IMPL BREAST 6.4X FULL 615 (Breast) IMPLANT
IMPL BRST 6.4X FULL 615CC (Breast) ×2 IMPLANT
IMPLANT BREAST GEL 615CC (Breast) ×6 IMPLANT
IV NS 500ML (IV SOLUTION)
IV NS 500ML BAXH (IV SOLUTION) ×1 IMPLANT
KIT FILL SYSTEM UNIVERSAL (SET/KITS/TRAYS/PACK) IMPLANT
MARKER SKIN DUAL TIP RULER LAB (MISCELLANEOUS) IMPLANT
NDL HYPO 25X1 1.5 SAFETY (NEEDLE) IMPLANT
NEEDLE HYPO 25X1 1.5 SAFETY (NEEDLE) IMPLANT
PACK BASIN DAY SURGERY FS (CUSTOM PROCEDURE TRAY) ×3 IMPLANT
PENCIL BUTTON HOLSTER BLD 10FT (ELECTRODE) ×3 IMPLANT
PIN SAFETY STERILE (MISCELLANEOUS) ×3 IMPLANT
PUNCH BIOPSY DERMAL 4MM (MISCELLANEOUS) ×2 IMPLANT
SHEET MEDIUM DRAPE 40X70 STRL (DRAPES) ×3 IMPLANT
SLEEVE SCD COMPRESS KNEE MED (MISCELLANEOUS) ×3 IMPLANT
SPONGE LAP 18X18 X RAY DECT (DISPOSABLE) ×6 IMPLANT
STAPLER VISISTAT 35W (STAPLE) ×3 IMPLANT
SUT ETHILON 2 0 FS 18 (SUTURE) ×2 IMPLANT
SUT MNCRL AB 4-0 PS2 18 (SUTURE) ×4 IMPLANT
SUT PDS 3-0 CT2 (SUTURE)
SUT PDS AB 2-0 CT2 27 (SUTURE) ×8 IMPLANT
SUT PDS II 3-0 CT2 27 ABS (SUTURE) IMPLANT
SUT VIC AB 3-0 PS1 18 (SUTURE)
SUT VIC AB 3-0 PS1 18XBRD (SUTURE) IMPLANT
SUT VIC AB 3-0 SH 27 (SUTURE) ×6
SUT VIC AB 3-0 SH 27X BRD (SUTURE) ×1 IMPLANT
SUT VICRYL 4-0 PS2 18IN ABS (SUTURE) ×4 IMPLANT
SYR 50ML LL SCALE MARK (SYRINGE) IMPLANT
SYR BULB IRRIGATION 50ML (SYRINGE) ×5 IMPLANT
SYR CONTROL 10ML LL (SYRINGE) IMPLANT
TISSUE ALLDRM 8X16 MED THICK (Tissue) IMPLANT
TOWEL OR 17X24 6PK STRL BLUE (TOWEL DISPOSABLE) ×6 IMPLANT
TUBE CONNECTING 20'X1/4 (TUBING) ×2
TUBE CONNECTING 20X1/4 (TUBING) ×3 IMPLANT
UNDERPAD 30X30 (UNDERPADS AND DIAPERS) ×2 IMPLANT
YANKAUER SUCT BULB TIP NO VENT (SUCTIONS) ×3 IMPLANT

## 2017-04-03 NOTE — Anesthesia Postprocedure Evaluation (Signed)
Anesthesia Post Note  Patient: Christina Logan  Procedure(s) Performed: REVISION bilateral  BREAST RECONSTRUCTION WITH SILICONE IMPLANT EXCHANGE AND ALLODERM (Bilateral Breast)     Patient location during evaluation: PACU Anesthesia Type: General Level of consciousness: awake and alert Pain management: pain level controlled Vital Signs Assessment: post-procedure vital signs reviewed and stable Respiratory status: spontaneous breathing, nonlabored ventilation, respiratory function stable and patient connected to nasal cannula oxygen Cardiovascular status: blood pressure returned to baseline and stable Postop Assessment: no apparent nausea or vomiting Anesthetic complications: no    Last Vitals:  Vitals:   04/03/17 1000 04/03/17 1050  BP: 107/84 114/78  Pulse: 79 72  Resp: 18 20  Temp:  36.8 C  SpO2: 99% 100%    Last Pain:  Vitals:   04/03/17 1050  TempSrc: Oral  PainSc: 3                  Ilanna Deihl P Stefen Juba

## 2017-04-03 NOTE — Discharge Instructions (Signed)

## 2017-04-03 NOTE — Anesthesia Preprocedure Evaluation (Addendum)
Anesthesia Evaluation  Patient identified by MRN, date of birth, ID band Patient awake    Reviewed: Allergy & Precautions, NPO status , Patient's Chart, lab work & pertinent test results  Airway Mallampati: I  TM Distance: >3 FB Neck ROM: Full    Dental  (+) Teeth Intact, Dental Advisory Given   Pulmonary neg pulmonary ROS,    breath sounds clear to auscultation       Cardiovascular Exercise Tolerance: Good  Rhythm:Regular Rate:Normal  ECG: SR, rate 60  PAC's and PVC's    Neuro/Psych PSYCHIATRIC DISORDERS Anxiety Depression negative neurological ROS     GI/Hepatic Neg liver ROS, PUD, Ulcerative colitis    Endo/Other  negative endocrine ROS  Renal/GU negative Renal ROS     Musculoskeletal negative musculoskeletal ROS (+)   Abdominal Normal abdominal exam  (+)   Peds  Hematology negative hematology ROS (+)   Anesthesia Other Findings   Reproductive/Obstetrics                            Anesthesia Physical  Anesthesia Plan  ASA: II  Anesthesia Plan: General   Post-op Pain Management:    Induction: Intravenous  PONV Risk Score and Plan: 2 and Ondansetron, Dexamethasone and Midazolam  Airway Management Planned: LMA  Additional Equipment:   Intra-op Plan:   Post-operative Plan: Extubation in OR  Informed Consent: I have reviewed the patients History and Physical, chart, labs and discussed the procedure including the risks, benefits and alternatives for the proposed anesthesia with the patient or authorized representative who has indicated his/her understanding and acceptance.   Dental advisory given  Plan Discussed with: CRNA  Anesthesia Plan Comments:         Anesthesia Quick Evaluation

## 2017-04-03 NOTE — Anesthesia Procedure Notes (Signed)
Procedure Name: Intubation Date/Time: 04/03/2017 7:39 AM Performed by: Genelle Bal Pre-anesthesia Checklist: Patient identified, Emergency Drugs available, Suction available and Patient being monitored Patient Re-evaluated:Patient Re-evaluated prior to induction Oxygen Delivery Method: Circle system utilized Preoxygenation: Pre-oxygenation with 100% oxygen Induction Type: IV induction Ventilation: Mask ventilation without difficulty Laryngoscope Size: Miller and 2 Grade View: Grade I Tube type: Oral Tube size: 7.0 mm Number of attempts: 1 Airway Equipment and Method: Stylet and Oral airway Placement Confirmation: ETT inserted through vocal cords under direct vision,  positive ETCO2 and breath sounds checked- equal and bilateral Secured at: 21 cm Tube secured with: Tape Dental Injury: Teeth and Oropharynx as per pre-operative assessment

## 2017-04-03 NOTE — Transfer of Care (Signed)
Immediate Anesthesia Transfer of Care Note  Patient: Christina Logan  Procedure(s) Performed: REVISION bilateral  BREAST RECONSTRUCTION WITH SILICONE IMPLANT EXCHANGE AND ALLODERM (Bilateral Breast)  Patient Location: PACU  Anesthesia Type:General  Level of Consciousness: awake, alert  and oriented  Airway & Oxygen Therapy: Patient Spontanous Breathing and Patient connected to face mask oxygen  Post-op Assessment: Report given to RN and Post -op Vital signs reviewed and stable  Post vital signs: Reviewed and stable  Last Vitals:  Vitals:   04/03/17 0630  BP: 114/79  Pulse: 79  Resp: 18  Temp: 36.7 C  SpO2: 100%    Last Pain:  Vitals:   04/03/17 0630  TempSrc: Oral         Complications: No apparent anesthesia complications

## 2017-04-03 NOTE — Interval H&P Note (Signed)
History and Physical Interval Note:  04/03/2017 7:01 AM  Christina Logan  has presented today for surgery, with the diagnosis of HISTORY OF BREAST CANCER  The various methods of treatment have been discussed with the patient and family. After consideration of risks, benefits and other options for treatment, the patient has consented to  Procedure(s): REVISION bilateral  BREAST RECONSTRUCTION WITH SILICONE IMPLANT EXCHANGE AND ALLODERM (Bilateral) as a surgical intervention .  The patient's history has been reviewed, patient examined, no change in status, stable for surgery.  I have reviewed the patient's chart and labs.  Questions were answered to the patient's satisfaction.     Christina Logan

## 2017-04-03 NOTE — Op Note (Signed)
Operative Note   DATE OF OPERATION: 10.30.18  LOCATION: [facility]-outpatient  SURGICAL DIVISION: Plastic Surgery  PREOPERATIVE DIAGNOSES:  1. Histroy breast cancer 2. Acquired absence breasts   POSTOPERATIVE DIAGNOSES:  same  PROCEDURE:  1. Revision bilateral breast reconstruction with silicone implant exchange 2. Acellular dermis (Alloderm) for breast reconstruction 225 cm2  SURGEON: Irene Limbo MD MBA  ASSISTANT: none  ANESTHESIA:  General.   EBL: 15 ml  COMPLICATIONS: None immediate.   INDICATIONS FOR PROCEDURE:  The patient, Christina Logan, is a 51 y.o. female born on 1966/03/26, is here for revision bilateral breast reconstruction. She has undergone implant based reconstruction following bilateral nipple sparing mastectomies. She presents with left AP malposition and bilateral lateral displacement in supine position. Plan reinforcement with acellular dermis.   FINDINGS: Left cohesive gel implant noted to have AP implant malpositions. Both implants intact. [place] Smooth Round Extra Projection Cohesive gel implants placed 615 ml bilateral, REF [id number] RIGHT [id number] LEFT SN [id number]  DESCRIPTION OF PROCEDURE:  The patient's operative site was marked with the patient in the preoperative area to mark sternal notch, desiredanterior axillary lines, chest midline. The patientwas taken to the operating room. SCDs were placed and IV antibiotics were given. The patient's operative site was prepped and draped in a sterile fashion. A time out was performed and all information was confirmed to be correct.Incision made in left inframammary fold and carried through superficial fascia and implant capsule. Intact implant removed which was noted to have anterior-posterior displacement.  Acellular dermis perforated and inset to chest wall with 2-0 PDS at medial to desired anterior axillary line and superior to desired inframammary fold. The ADM was then redraped over prior  implant and sutured to anterior capsule with 2-0 PDS. Additional plication suture placed with interrupted 2-0 PDS to chest wall and anterior mastectomy flap capsule. 15 Fr JP placed and secured with 2-0 nylon.  I then directed attention to right chest. Capsule entered and intact implant removed. Acellular dermis perforated and inset to chest wall with 2-0 PDS at medial to desired anterior axillary line and superior to inframammary fold. The ADM was then redraped over prior implant and sutured to anterior capsule with 2-0 PDS. Additional plication suture placed with interrupted 2-0 PDS to chest wall and anterior mastectomy flap capsule. Total 225cm2 ADM utilized for reinforcement bilateral. 15 Fr JP placed and secured with 2-0 nylon.  At this time breast cavities irrigated with solution containing Ancef, gentamicin, and bacitracin, followed by Betadine. Implant placed in left breast cavity. Care taken to ensure proper orientation. Closure was completed with 3-0 vicryl for approximation of capsule and superficial fascia. 4-0 vicryl was placed in dermis and running 4-0 monocryl was used to close skin.Overright breast following implant placement, closure completed with similar 3-0 vicryl in superficial fascia. 4-0 vicryl was placed in dermis and running 4-0 monocryl was used to close skin.  The patient was allowed to wake from anesthesia, extubated and taken to the recovery room in satisfactory condition.   SPECIMENS: none  DRAINS: 15 Fr JP in right and left breast reconstruction  Irene Limbo, MD [facility] Plastic & Reconstructive Surgery [phone], pin [phone]

## 2017-04-04 ENCOUNTER — Encounter (HOSPITAL_BASED_OUTPATIENT_CLINIC_OR_DEPARTMENT_OTHER): Payer: Self-pay | Admitting: Plastic Surgery

## 2017-05-04 MED FILL — VALACYCLOVIR HCL 500 MG TAB: 500 | 5 days supply | Qty: 10 | Fill #0

## 2017-05-07 MED FILL — BUPROPION HCL SR 200 MG TAB: 200 | 90 days supply | Qty: 180 | Fill #1

## 2017-06-06 MED FILL — clonazePAM 0.5 MG TABS: 0.5 | 10 days supply | Qty: 10 | Fill #1

## 2017-06-22 DIAGNOSIS — K5190 Ulcerative colitis, unspecified, without complications: Secondary | ICD-10-CM | POA: Diagnosis not present

## 2017-06-22 DIAGNOSIS — K5900 Constipation, unspecified: Secondary | ICD-10-CM | POA: Diagnosis not present

## 2017-06-22 MED FILL — VALACYCLOVIR HCL 500 MG TAB: 500 | 5 days supply | Qty: 10 | Fill #0 | Status: TO

## 2017-08-13 DIAGNOSIS — D0510 Intraductal carcinoma in situ of unspecified breast: Secondary | ICD-10-CM | POA: Diagnosis not present

## 2017-08-21 MED FILL — BUPROPION HCL SR 200 MG TAB: 200 | 90 days supply | Qty: 180 | Fill #0 | Status: TO

## 2017-12-21 DIAGNOSIS — K5190 Ulcerative colitis, unspecified, without complications: Secondary | ICD-10-CM | POA: Diagnosis not present

## 2017-12-21 DIAGNOSIS — D0511 Intraductal carcinoma in situ of right breast: Secondary | ICD-10-CM | POA: Diagnosis not present

## 2017-12-21 DIAGNOSIS — Z0000 Encounter for general adult medical examination without abnormal findings: Secondary | ICD-10-CM | POA: Diagnosis not present

## 2017-12-21 DIAGNOSIS — M542 Cervicalgia: Secondary | ICD-10-CM | POA: Diagnosis not present

## 2017-12-24 DIAGNOSIS — Z136 Encounter for screening for cardiovascular disorders: Secondary | ICD-10-CM | POA: Diagnosis not present

## 2017-12-24 DIAGNOSIS — Z0000 Encounter for general adult medical examination without abnormal findings: Secondary | ICD-10-CM | POA: Diagnosis not present

## 2018-02-06 DIAGNOSIS — Z9013 Acquired absence of bilateral breasts and nipples: Secondary | ICD-10-CM | POA: Diagnosis not present

## 2018-02-06 DIAGNOSIS — Z853 Personal history of malignant neoplasm of breast: Secondary | ICD-10-CM | POA: Diagnosis not present

## 2018-02-08 DIAGNOSIS — Z01419 Encounter for gynecological examination (general) (routine) without abnormal findings: Secondary | ICD-10-CM | POA: Diagnosis not present

## 2018-02-08 DIAGNOSIS — Z6822 Body mass index (BMI) 22.0-22.9, adult: Secondary | ICD-10-CM | POA: Diagnosis not present

## 2018-02-17 DIAGNOSIS — N390 Urinary tract infection, site not specified: Secondary | ICD-10-CM | POA: Diagnosis not present

## 2018-03-18 IMAGING — MR MR ABDOMEN WO/W CM
11 of 17 series · 24 of 48 positions shown · IV contrast (14ml Multihance)
Comparison: CT abdomen/pelvis dated 11/22/2016 and 06/01/2011

CLINICAL DATA: Liver lesion in the left hepatic lobe

EXAM:
MRI ABDOMEN WITHOUT AND WITH CONTRAST
TECHNIQUE: Multiplanar multisequence MR imaging of the abdomen was performed
both before and after the administration of intravenous contrast.
CONTRAST:  14mL MULTIHANCE GADOBENATE DIMEGLUMINE 529 MG/ML IV SOLN

[Series 3: T2 · coronal · 5.0mm · 1.56mm/px · 1 of 28 slices shown (1 of 3)]
[im 1/28]
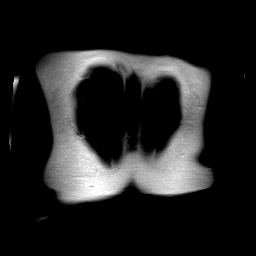

[Series 4: axial tru fisp · axial · 4.0mm · 1.48mm/px · 1 of 38 slices shown]
[im 1/38]
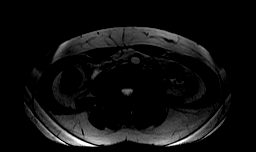

[Series 5: T2 · axial · 6.5mm · 0.74mm/px · 1 of 24 slices shown (2 of 3)]
[im 1/24]
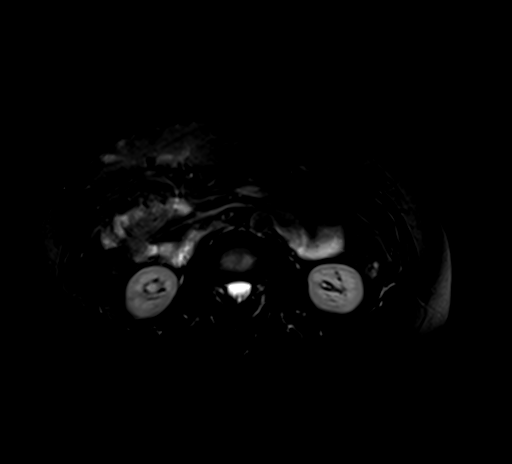

[Series 6: T2 · axial · 4.9mm · 1.37mm/px · 1 of 38 slices shown (3 of 3)]
[im 1/38]
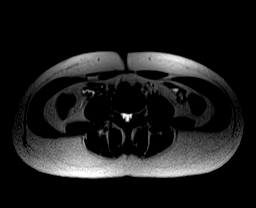

[Series 7: ep2d_diff_b50_500_800_p2 · axial · 6.0mm · 1.98mm/px · z∈[-15,+180]mm · 2 of 78 slices shown]
[im 1/78]
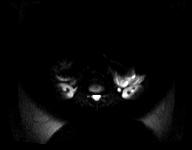
[im 78/78]
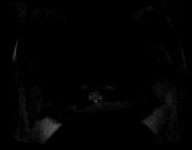

[Series 8: ep2d_[id number]_500_800_p2_adc · axial · 6.0mm · 1.98mm/px · 1 of 26 slices shown]
[im 1/26]
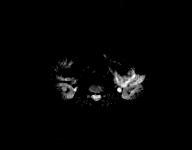

[Series 9: axial in out · axial · 6.0mm · 0.74mm/px · z∈[-60,+147]mm · 2 of 62 slices shown]
[im 1/62]
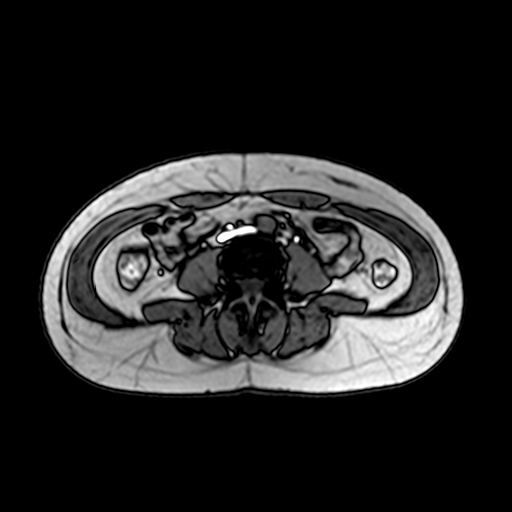
[im 62/62]
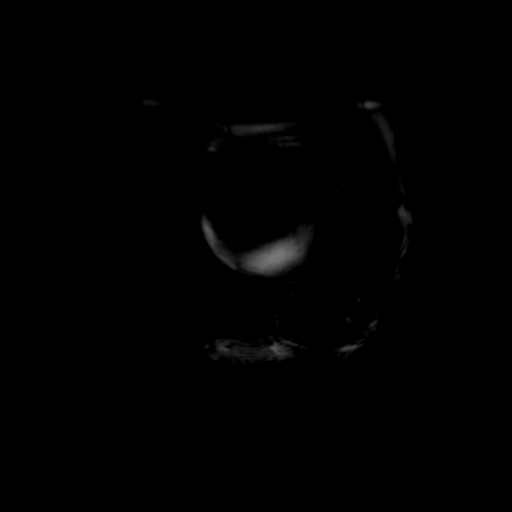

[Series 10: T1 dynamic · axial · non-contrast · 2.3mm · 0.78mm/px · z∈[-50,+135]mm · 4 of 80 slices shown]
[im 1/80]
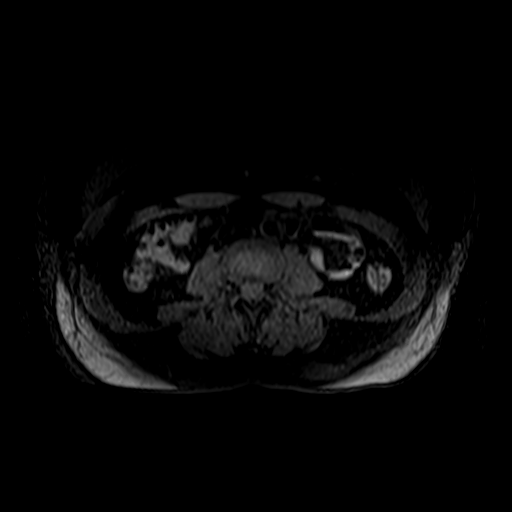
[im 27/80]
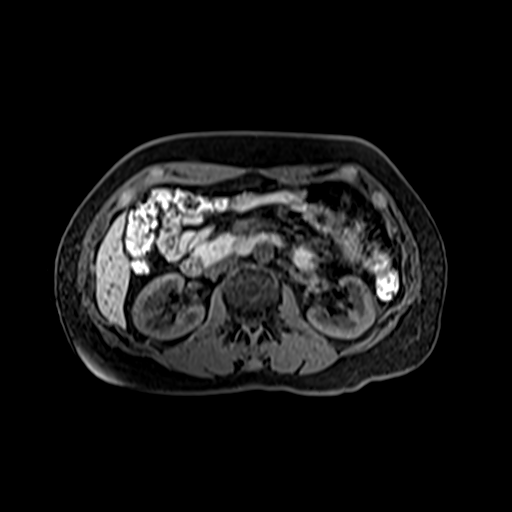
[im 53/80]
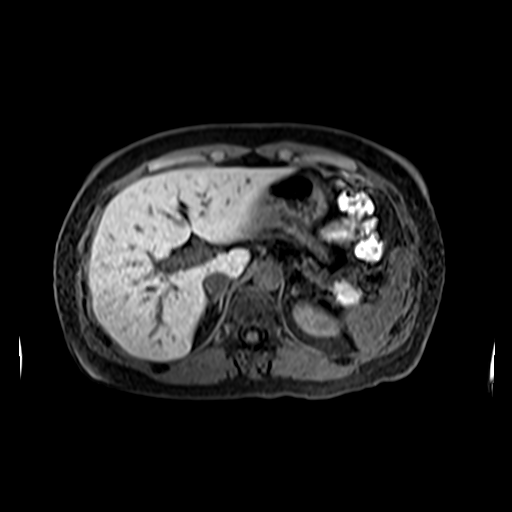
[im 80/80]
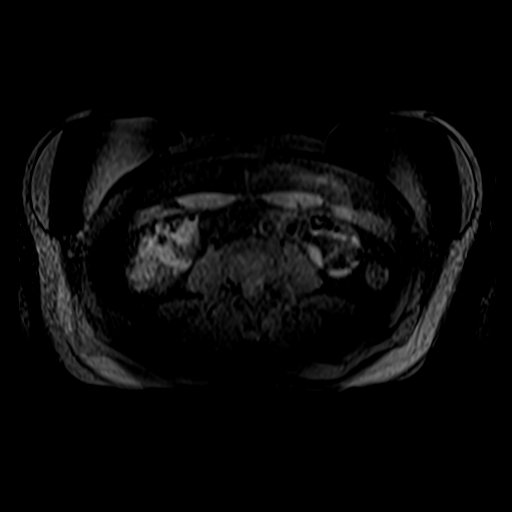

[Series 11: post 25 sec · axial · 2.3mm · 0.78mm/px · z∈[-50,+135]mm · 4 of 80 slices shown]
[im 1/80]
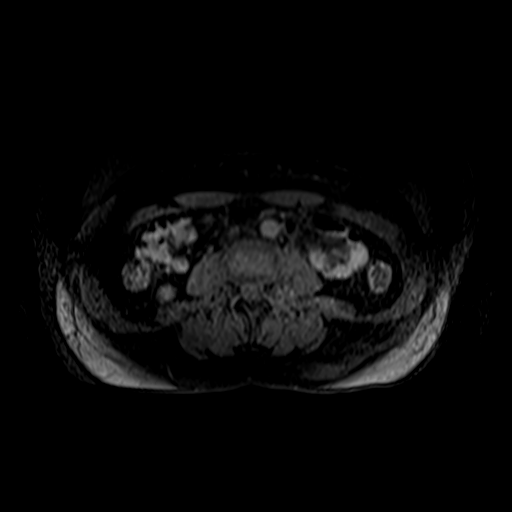
[im 27/80]
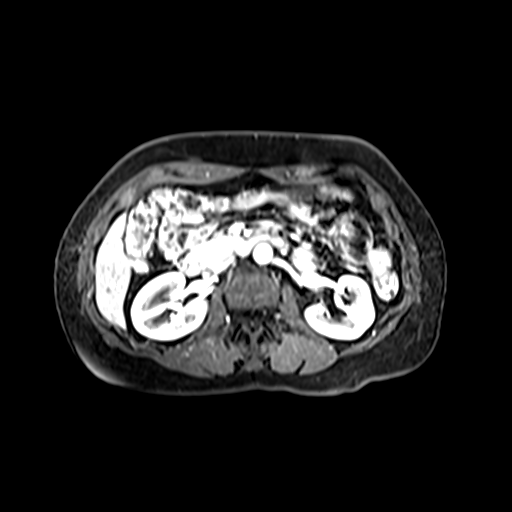
[im 53/80]
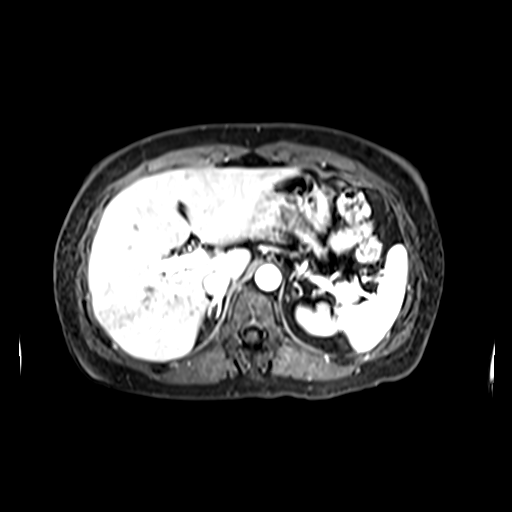
[im 80/80]
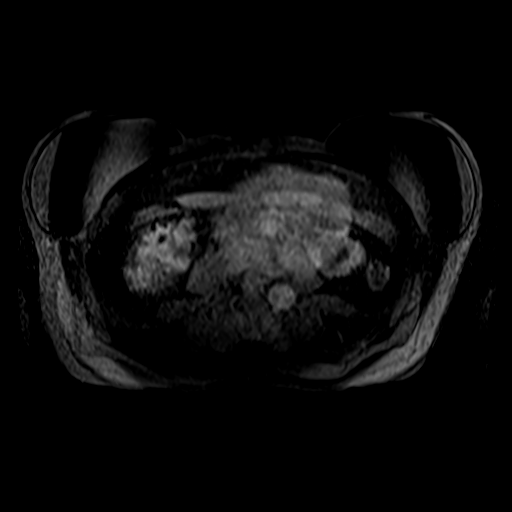

[Series 12: post 25 sec_sub · axial · 2.3mm · 0.78mm/px · z∈[-50,+135]mm · 4 of 80 slices shown]
[im 1/80]
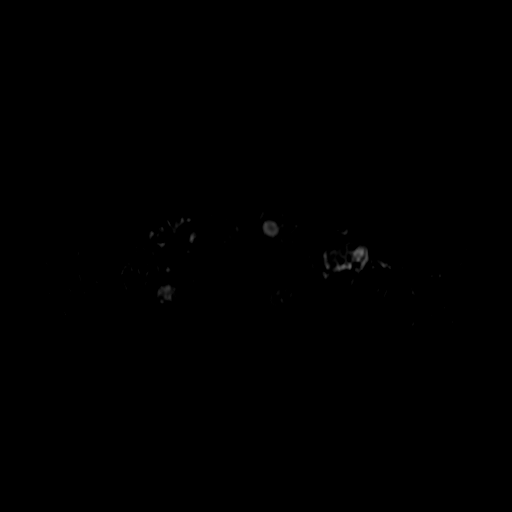
[im 27/80]
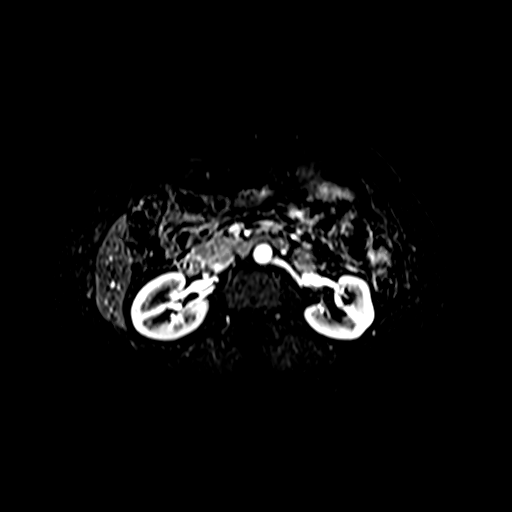
[im 53/80]
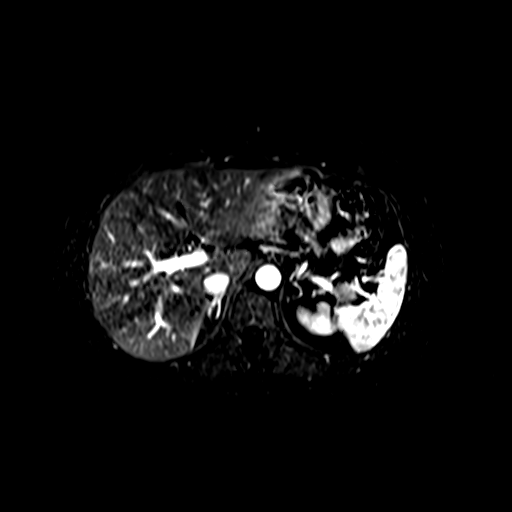
[im 80/80]
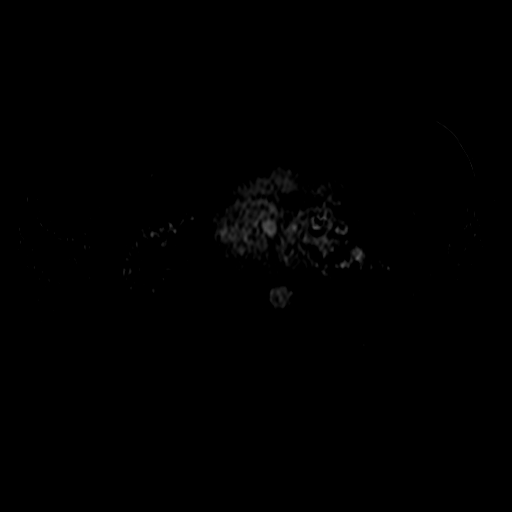

[Series 13: post 45 sec · axial · 2.3mm · 0.78mm/px · z∈[-50,+72]mm · 3 of 80 slices shown]
[im 1/80]
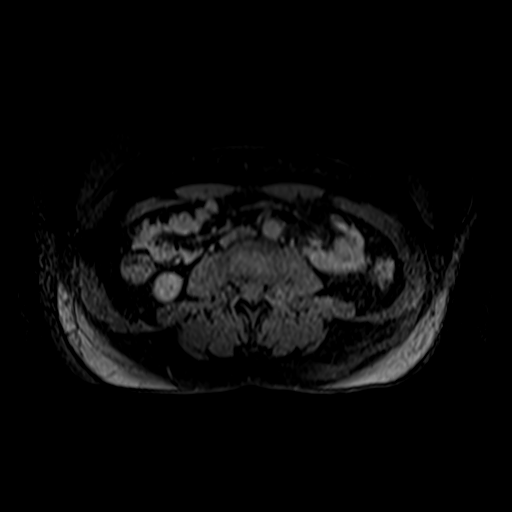
[im 27/80]
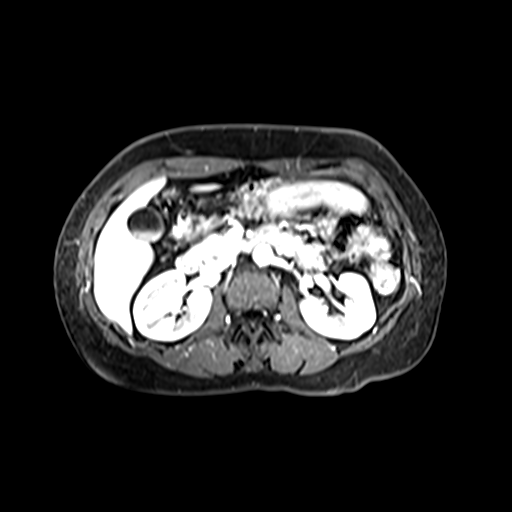
[im 53/80]
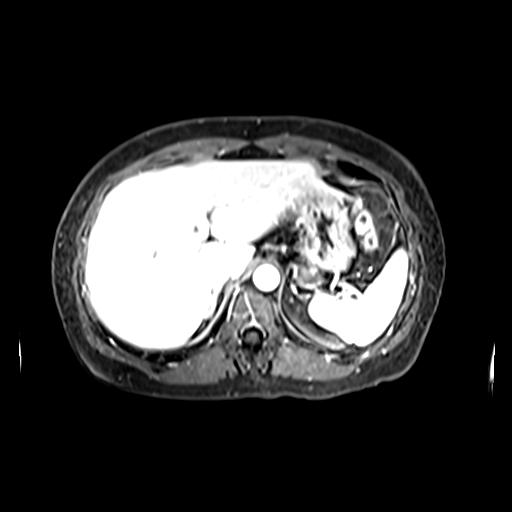

[24 of 48 positions shown; findings below may reference images not displayed]

FINDINGS: Lower chest: Lung bases are clear.  Bilateral breast augmentation.

Hepatobiliary: 2.2 x 3.2 x 3.1 cm lobulated cyst in segment 3 of the
liver (series 5/ image 14), benign. No suspicious/enhancing hepatic
lesions.

Layering gallbladder sludge. No associated inflammatory changes. No
intrahepatic or extrahepatic ductal dilatation.

Pancreas:  Within normal limits.

Spleen:  Within normal limits.

Adrenals/Urinary Tract:  Adrenal glands are within normal limits.

Kidneys are within normal limits.  No hydronephrosis.

Stomach/Bowel: Stomach is within normal limits.

Visualized bowel is unremarkable.

Vascular/Lymphatic:  No evidence of abdominal aortic aneurysm.

No suspicious abdominal lymphadenopathy.

Other:  No abdominal ascites.

Musculoskeletal: No focal osseous lesions.
IMPRESSION: 3.2 cm lobulated cyst in segment 3 of the liver, benign.

Layering gallbladder sludge.

## 2018-06-24 DIAGNOSIS — M542 Cervicalgia: Secondary | ICD-10-CM | POA: Diagnosis not present

## 2018-07-18 ENCOUNTER — Encounter: Payer: Self-pay | Admitting: Physical Therapy

## 2018-07-18 ENCOUNTER — Other Ambulatory Visit: Payer: Self-pay

## 2018-07-18 ENCOUNTER — Ambulatory Visit: Payer: Commercial Managed Care - PPO | Attending: Family Medicine | Admitting: Physical Therapy

## 2018-07-18 DIAGNOSIS — M6281 Muscle weakness (generalized): Secondary | ICD-10-CM | POA: Insufficient documentation

## 2018-07-18 DIAGNOSIS — R252 Cramp and spasm: Secondary | ICD-10-CM | POA: Insufficient documentation

## 2018-07-18 DIAGNOSIS — G8929 Other chronic pain: Secondary | ICD-10-CM | POA: Diagnosis not present

## 2018-07-18 DIAGNOSIS — M542 Cervicalgia: Secondary | ICD-10-CM | POA: Diagnosis not present

## 2018-07-18 DIAGNOSIS — M25511 Pain in right shoulder: Secondary | ICD-10-CM | POA: Insufficient documentation

## 2018-07-18 NOTE — Patient Instructions (Signed)
Access Code: [email or web address]  URL: [email or web address]  Date: 07/18/2018  Prepared by: Earlie Counts   Exercises  Standing Ulnar Nerve Glide - 5 reps - 1 sets - 2x daily - 7x weekly  Standing Median Nerve Glide - 5 reps - 1 sets - 2x daily - 7x weekly  Quadruped Full Range Thoracic Rotation with Reach - 2 reps - 1 sets - 5 sec hold - 1x daily - 7x weekly  Open Book Chest Stretch on Towel Roll - 1 reps - 1 sets - 15 sec hold - 1x daily - 7x weekly  Supine Thoracic Mobilization Towel Roll Vertical with Arm Stretch - 1 reps - 1 sets - 15 sec hold - 1x daily - 7x weekly  [facility] [address], [place], [place] [address] Phone # [phone] Fax [phone]

## 2018-07-18 NOTE — Therapy (Signed)
[facility] [address], [place], [place], [address] Phone: [phone]   Fax:  [phone]  Physical Therapy Evaluation  Patient Details  Name: Christina Logan MRN: [id number] Date of Birth: Mar 19, 1966 Referring Provider (PT): Dr. London Pepper   Encounter Date: 07/18/2018  PT End of Session - 07/18/18 0946    Visit Number  1    Date for PT Re-Evaluation  09/12/18    Authorization Type  [employer]    PT Start Time  0845    PT Stop Time  0940    PT Time Calculation (min)  55 min    Activity Tolerance  Patient tolerated treatment well;No increased pain    Behavior During Therapy  WFL for tasks assessed/performed       Past Medical History:  Diagnosis Date  . Allergic rhinitis   . Anxiety   . Depression   . Dysrhythmia    occ PVC's, PAC's  . PVCs (premature ventricular contractions)   . Ulcerative colitis [facility])     Past Surgical History:  Procedure Laterality Date  . BREAST IMPLANT EXCHANGE Bilateral 02/22/2016   Procedure: REVISION OF RECONSTRUCTIVE BILATERAL BREAST WITH SILCONE BREAST IMPLANT EXCHANGE;  Surgeon: Irene Limbo, MD;  Location: [place];  Service: Plastics;  Laterality: Bilateral;  . BREAST IMPLANT EXCHANGE Bilateral 04/03/2017   Procedure: REVISION bilateral  BREAST RECONSTRUCTION WITH SILICONE IMPLANT EXCHANGE AND ALLODERM;  Surgeon: Irene Limbo, MD;  Location: [place];  Service: Plastics;  Laterality: Bilateral;  . BREAST RECONSTRUCTION WITH PLACEMENT OF TISSUE EXPANDER AND FLEX HD (ACELLULAR HYDRATED DERMIS) Bilateral 06/29/2015   Procedure: BILATERAL BREAST RECONSTRUCTION WITH PLACEMENT OF TISSUE EXPANDER AND FLEX HD (ACELLULAR HYDRATED DERMIS);  Surgeon: Irene Limbo, MD;  Location: [place];  Service: Plastics;  Laterality: Bilateral;  . COLONOSCOPY     2011/2012  . laproscopic exam for fertility    . LIPOSUCTION WITH  LIPOFILLING Bilateral 09/28/2015   Procedure: LIPO FILLING FROM ABDOMEN TO BILATERAL CHEST;  Surgeon: Irene Limbo, MD;  Location: [place];  Service: Plastics;  Laterality: Bilateral;  . LIPOSUCTION WITH LIPOFILLING Bilateral 02/22/2016   Procedure: LIPOFILLING TO BILATERAL CHEST;  Surgeon: Irene Limbo, MD;  Location: [place];  Service: Plastics;  Laterality: Bilateral;  . NIPPLE SPARING MASTECTOMY/SENTINAL LYMPH NODE BIOPSY/RECONSTRUCTION/PLACEMENT OF TISSUE EXPANDER Bilateral 06/29/2015   Procedure: RIGHT NIPPLE SPARING MASTECTOMY WITH SENTINAL LYMPH NODE BIOPSY AND LEFT PROPHYLACTIC NIPPLE SPARING MASTECTOMY;  Surgeon: Rolm Bookbinder, MD;  Location: [place];  Service: General;  Laterality: Bilateral;  . REMOVAL OF BILATERAL TISSUE EXPANDERS WITH PLACEMENT OF BILATERAL BREAST IMPLANTS Bilateral 09/28/2015   Procedure: REMOVAL OF BILATERAL TISSUE EXPANDERS WITH PLACEMENT OF BILATERAL BREAST IMPLANTS;  Surgeon: Irene Limbo, MD;  Location: [place];  Service: Plastics;  Laterality: Bilateral;  . right foot surgery      There were no vitals filed for this visit.   Subjective Assessment - 07/18/18 0848    Subjective  Patient reports cervical and right shoulder pain started in 2018 surgery and avoided using the right arm. Patient feels she was using her right shoulder muscles inappropriately.     Patient Stated Goals  decrease pain    Currently in Pain?  Yes    Pain Score  3     Pain Location  Neck    Pain Orientation  Right    Pain Descriptors / Indicators  Stabbing;Heaviness    Pain  Type  Chronic pain    Pain Onset  More than a month ago    Pain Frequency  Constant    Aggravating Factors   after work with using right arm, laying on right side, turning to look behind her bilateral    Pain Relieving Factors  heat    Multiple Pain Sites  Yes    Pain Score  3   low is 0/10   Pain Location  Shoulder    Pain  Orientation  Right    Pain Descriptors / Indicators  Sharp    Pain Type  Chronic pain    Pain Onset  More than a month ago    Pain Frequency  Intermittent    Aggravating Factors   right shoulder movement    Pain Relieving Factors  keep arm at side         [facility] PT Assessment - 07/18/18 0001      Assessment   Medical Diagnosis  M54.2 Neck pain    Referring Provider (PT)  Dr. London Pepper    Onset Date/Surgical Date  03/05/17    Prior Therapy  none      Precautions   Precautions  Other (comment)    Precaution Comments  breast cancer with 5 lymph nodes removed      Restrictions   Weight Bearing Restrictions  No      Balance Screen   Has the patient fallen in the past 6 months  No    Has the patient had a decrease in activity level because of a fear of falling?   No    Is the patient reluctant to leave their home because of a fear of falling?   No      Home Film/video editor residence      Prior Function   Level of Independence  Independent    Vocation  Part time employment    Leisure  walking      Cognition   Overall Cognitive Status  Within Functional Limits for tasks assessed      Observation/Other Assessments   Focus on Therapeutic Outcomes (FOTO)   47% limitation; goal 35% limitation      Posture/Postural Control   Posture/Postural Control  No significant limitations      ROM / Strength   AROM / PROM / Strength  AROM;PROM;Strength      AROM   Right Shoulder Flexion  140 Degrees    Right Shoulder ABduction  150 Degrees    Cervical Flexion  22    Cervical Extension  15    Cervical - Right Side Bend  15    Cervical - Left Side Bend  30    Cervical - Right Rotation  60    Cervical - Left Rotation  55    Thoracic - Left Rotation  decreased by 25%      PROM   Right Shoulder Internal Rotation  60 Degrees      Strength   Right Shoulder Flexion  4/5    Right Shoulder ABduction  4/5    Right Shoulder Internal Rotation  4/5    Right  Shoulder External Rotation  4/5    Right Shoulder Horizontal ABduction  4/5    Right Elbow Flexion  4/5    Right Elbow Extension  4/5      Palpation   Spinal mobility  Decreased movement of T1-T5, C3-C5    Palpation comment  decreased bil. lower rib  cage mobility with bucket handle movement, tenderness located on cervial scalenes, SCM, cervical paraspinals, interscapular along the mandible, right shoulder blade is further from the spine, Tightness in the right pectoralis      Special Tests   Other special tests  positive for neural tension stretch to right for brachial plexus and ulnar nerve                Objective measurements completed on examination: See above findings.              PT Education - 07/18/18 [place]    Education Details  Access Code: [email or web address]     Person(s) Educated  Patient    Methods  Explanation;Demonstration;Verbal cues;Handout    Comprehension  Returned demonstration;Verbalized understanding       PT Short Term Goals - 07/18/18 1001      PT SHORT TERM GOAL #1   Title  independent with HEP    Time  4    Period  Weeks    Status  New    Target Date  08/15/18      PT SHORT TERM GOAL #2   Title  abilty to use right arm with pain level decreased >/= 25%    Time  4    Period  Weeks    Status  New    Target Date  08/15/18      PT SHORT TERM GOAL #3   Title  full mobility of rib cage with breath and right shoulder ROM due to reduction in tissue tightness    Time  4    Period  Weeks    Status  New    Target Date  08/15/18      PT SHORT TERM GOAL #4   Title  full cervical ROM due to reduction in restrictions    Time  4    Period  Weeks    Status  New    Target Date  08/15/18        PT Long Term Goals - 07/18/18 1002      PT LONG TERM GOAL #1   Title  independent with HEP and understand how to progress herself    Time  8    Period  Weeks    Status  New    Target Date  09/12/18      PT LONG TERM GOAL #2   Title  abitly to  use right arm with minimal to no difficulty due to strength 5/5    Time  8    Period  Weeks    Status  New    Target Date  09/12/18      PT LONG TERM GOAL #3   Title  ability to lift items with right arm without tightening her cervical muscles due to reduction in compensation    Time  8    Period  Weeks    Status  New    Target Date  09/12/18      PT LONG TERM GOAL #4   Title  full cervical ROM so patient is able to look behind her without difficulty    Time  4    Period  Weeks    Status  New    Target Date  09/12/18      PT LONG TERM GOAL #5   Title  after work her neck and shoulder pain decreased >/= 75% due to improve strength and reduction in compensation    Time  8  Period  Weeks    Status  New    Target Date  09/12/18             Plan - 07/18/18 0947    Clinical Impression Statement  Patient is a 53 year old female with right cervical and shoulder pain since 2018 when she had breast implants and breast cancer. Patient reports constant cervical pain at level 3/10  and intermittent shoulder pain at level 3/10. Patient reports her pain is worse with neck and shoulder movement, after work, reaching with right arm, looking behind her and laying on right side. Patient has decreased cervical ROM. Right elbow internal rotation is limited to 60 degrees. Right shoulder A/ROM is limited for flexion and abduction and internal rotation. Right shoulder and elbow strength is 4/5. Decreased bucket handle movement of the lower rib cage. Right scapula is in a protracted position. Decreased mobility  of C4-C6 and T1-T5. Tightness in the right interscapular muscles, right pectoralis, right cervical paraspinals, right scalenes and Sternocliedomastoid. Positive neural tension for right brachial plexus and ulnar nerve. When patient uses her right arm she will tighten her cervical muscles. Patient will benefit from skilled therapy to improve mobility of cervial and right shoulder while  strengthening the right upper extremity and correcting muscle imbalances.     History and Personal Factors relevant to plan of care:  Breast cancer; bilateral masectomy; breast implants    Clinical Presentation  Evolving    Clinical Presentation due to:  difficulty using right arm for work, trouble looking behind her, laying on right side to sleep    Clinical Decision Making  Moderate    Rehab Potential  Excellent    Clinical Impairments Affecting Rehab Potential  Breast cancer; bilateral masectomy; breast implants    PT Frequency  2x / week    PT Duration  8 weeks    PT Treatment/Interventions  Electrical Stimulation;Iontophoresis 4mg /ml Dexamethasone;Therapeutic activities;Therapeutic exercise;Neuromuscular re-education;Manual techniques;Patient/family education;Passive range of motion;Dry needling;Taping    PT Next Visit Plan  joint mobilization to cervical, thoracic, rib cage and right shoulder, soft tissue work to right scapula, cervical, along jaw, and pectoralis, dry needling to cervial and interscapula, right shoulder and interscapular strength    PT Home Exercise Plan  Access Code: [email or web address]     Consulted and Agree with Plan of Care  Patient       Patient will benefit from skilled therapeutic intervention in order to improve the following deficits and impairments:  Pain, Increased fascial restricitons, Decreased mobility, Increased muscle spasms, Decreased activity tolerance, Decreased endurance, Decreased range of motion, Decreased strength  Visit Diagnosis: Cervicalgia - Plan: PT plan of care cert/re-cert  Chronic right shoulder pain - Plan: PT plan of care cert/re-cert  Muscle weakness (generalized) - Plan: PT plan of care cert/re-cert  Cramp and spasm - Plan: PT plan of care cert/re-cert     Problem List Patient Active Problem List   Diagnosis Date Noted  . Breast cancer, right breast ([place]) 08/02/2015  . S/P mastectomy 06/29/2015  . 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 Palpitations 01/21/2014  . Family history of breast cancer 10/02/2011    Earlie Counts, PT 07/18/18 10:08 AM    [facility] [address], [place], [place], [address] Phone: [phone]   Fax:  [phone]  Name: Christina Logan MRN: [id number] Date of Birth: 08/26/1965

## 2018-07-22 ENCOUNTER — Ambulatory Visit: Payer: Commercial Managed Care - PPO | Admitting: Physical Therapy

## 2018-07-22 ENCOUNTER — Encounter: Payer: Self-pay | Admitting: Physical Therapy

## 2018-07-22 DIAGNOSIS — M25511 Pain in right shoulder: Secondary | ICD-10-CM

## 2018-07-22 DIAGNOSIS — M542 Cervicalgia: Secondary | ICD-10-CM | POA: Diagnosis not present

## 2018-07-22 DIAGNOSIS — G8929 Other chronic pain: Secondary | ICD-10-CM | POA: Diagnosis not present

## 2018-07-22 DIAGNOSIS — M6281 Muscle weakness (generalized): Secondary | ICD-10-CM

## 2018-07-22 NOTE — Therapy (Signed)
[facility] [address], [place], [place], [address] Phone: [phone]   Fax:  [phone]  Physical Therapy Treatment  Patient Details  Name: Christina Logan MRN: [id number] Date of Birth: April 18, 1966 Referring Provider (PT): Dr. London Pepper   Encounter Date: 07/22/2018  PT End of Session - 07/22/18 0844    Visit Number  2    Date for PT Re-Evaluation  09/12/18    Authorization Type  UHC    PT Start Time  0845    PT Stop Time  0925    PT Time Calculation (min)  40 min    Activity Tolerance  Patient tolerated treatment well;No increased pain    Behavior During Therapy  WFL for tasks assessed/performed       Past Medical History:  Diagnosis Date  . Allergic rhinitis   . Anxiety   . Depression   . Dysrhythmia    occ PVC's, PAC's  . PVCs (premature ventricular contractions)   . Ulcerative colitis [facility])     Past Surgical History:  Procedure Laterality Date  . BREAST IMPLANT EXCHANGE Bilateral 02/22/2016   Procedure: REVISION OF RECONSTRUCTIVE BILATERAL BREAST WITH SILCONE BREAST IMPLANT EXCHANGE;  Surgeon: Irene Limbo, MD;  Location: [place];  Service: Plastics;  Laterality: Bilateral;  . BREAST IMPLANT EXCHANGE Bilateral 04/03/2017   Procedure: REVISION bilateral  BREAST RECONSTRUCTION WITH SILICONE IMPLANT EXCHANGE AND ALLODERM;  Surgeon: Irene Limbo, MD;  Location: [place];  Service: Plastics;  Laterality: Bilateral;  . BREAST RECONSTRUCTION WITH PLACEMENT OF TISSUE EXPANDER AND FLEX HD (ACELLULAR HYDRATED DERMIS) Bilateral 06/29/2015   Procedure: BILATERAL BREAST RECONSTRUCTION WITH PLACEMENT OF TISSUE EXPANDER AND FLEX HD (ACELLULAR HYDRATED DERMIS);  Surgeon: Irene Limbo, MD;  Location: [place];  Service: Plastics;  Laterality: Bilateral;  . COLONOSCOPY     2011/2012  . laproscopic exam for fertility    . LIPOSUCTION WITH LIPOFILLING  Bilateral 09/28/2015   Procedure: LIPO FILLING FROM ABDOMEN TO BILATERAL CHEST;  Surgeon: Irene Limbo, MD;  Location: [place];  Service: Plastics;  Laterality: Bilateral;  . LIPOSUCTION WITH LIPOFILLING Bilateral 02/22/2016   Procedure: LIPOFILLING TO BILATERAL CHEST;  Surgeon: Irene Limbo, MD;  Location: [place];  Service: Plastics;  Laterality: Bilateral;  . NIPPLE SPARING MASTECTOMY/SENTINAL LYMPH NODE BIOPSY/RECONSTRUCTION/PLACEMENT OF TISSUE EXPANDER Bilateral 06/29/2015   Procedure: RIGHT NIPPLE SPARING MASTECTOMY WITH SENTINAL LYMPH NODE BIOPSY AND LEFT PROPHYLACTIC NIPPLE SPARING MASTECTOMY;  Surgeon: Rolm Bookbinder, MD;  Location: [place];  Service: General;  Laterality: Bilateral;  . REMOVAL OF BILATERAL TISSUE EXPANDERS WITH PLACEMENT OF BILATERAL BREAST IMPLANTS Bilateral 09/28/2015   Procedure: REMOVAL OF BILATERAL TISSUE EXPANDERS WITH PLACEMENT OF BILATERAL BREAST IMPLANTS;  Surgeon: Irene Limbo, MD;  Location: [place];  Service: Plastics;  Laterality: Bilateral;  . right foot surgery      There were no vitals filed for this visit.  Subjective Assessment - 07/22/18 0847    Subjective  I felt better after the last session.     Patient Stated Goals  decrease pain    Currently in Pain?  Yes    Pain Score  3     Pain Location  Neck    Pain Orientation  Right    Pain Descriptors / Indicators  Stabbing;Heaviness    Pain Type  Chronic pain    Pain Onset  More than a month ago    Pain Frequency  Constant    Aggravating Factors   after work wit using rigth arm, laying on right side, turning to look behind her bilateral     Pain Relieving Factors  heat    Multiple Pain Sites  No    Pain Score  3    Pain Location  Shoulder    Pain Orientation  Right    Pain Descriptors / Indicators  Sharp    Pain Type  Chronic pain    Pain Onset  More than a month ago    Pain Frequency  Intermittent     Aggravating Factors   right shoulder movement    Pain Relieving Factors  keep arm at side                       [facility] Adult PT Treatment/Exercise - 07/22/18 0001      Shoulder Exercises: Prone   Horizontal ABduction 1  Strengthening;Both;10 reps   laying on red physioball   Other Prone Exercises  Prone on red ball bil. shoulder Y movement wiht tactile cues to rotate scapula      Shoulder Exercises: Standing   Other Standing Exercises  roll ball upward on  wall 10x      Shoulder Exercises: ROM/Strengthening   Wall Pushups  10 reps    Wall Pushups Limitations  using red physic ball, VC to tighten core    Ball on Wall  10 times    Other ROM/Strengthening Exercises  The UE ranger for right shoulder flexion at 24 with tactile cues to relax the upper trap; right shoulder abduction level 24 with tactile cues to relax the upper trap      Manual Therapy   Manual Therapy  Soft tissue mobilization;Joint mobilization;Taping    Joint Mobilization  T2-T6 p-a and rotational mobilization grade III    Soft tissue mobilization  bil. cervical paraspinals, upper trap and interscapular    McConnell  inhibit right upper trap       Trigger Point Dry Needling - 07/22/18 0905    Consent Given?  Yes    Education Handout Provided  Yes    Muscles Treated Upper Body  Upper trapezius;Levator scapulae;Rhomboids   bil. cervical multifidi C2-C6   Upper Trapezius Response  Twitch reponse elicited;Palpable increased muscle length    Levator Scapulae Response  Twitch response elicited;Palpable increased muscle length    Rhomboids Response  Twitch response elicited;Palpable increased muscle length           PT Education - 07/22/18 0927    Education Details  information on dry needling    Person(s) Educated  Patient    Methods  Explanation;Demonstration;Verbal cues;Handout    Comprehension  Returned demonstration;Verbalized understanding       PT Short Term Goals - 07/18/18 1001       PT SHORT TERM GOAL #1   Title  independent with HEP    Time  4    Period  Weeks    Status  New    Target Date  08/15/18      PT SHORT TERM GOAL #2   Title  abilty to use right arm with pain level decreased >/= 25%    Time  4    Period  Weeks    Status  New    Target Date  08/15/18      PT SHORT TERM GOAL #3   Title  full mobility of rib cage with breath and right shoulder ROM due  to reduction in tissue tightness    Time  4    Period  Weeks    Status  New    Target Date  08/15/18      PT SHORT TERM GOAL #4   Title  full cervical ROM due to reduction in restrictions    Time  4    Period  Weeks    Status  New    Target Date  08/15/18        PT Long Term Goals - 07/18/18 1002      PT LONG TERM GOAL #1   Title  independent with HEP and understand how to progress herself    Time  8    Period  Weeks    Status  New    Target Date  09/12/18      PT LONG TERM GOAL #2   Title  abitly to use right arm with minimal to no difficulty due to strength 5/5    Time  8    Period  Weeks    Status  New    Target Date  09/12/18      PT LONG TERM GOAL #3   Title  ability to lift items with right arm without tightening her cervical muscles due to reduction in compensation    Time  8    Period  Weeks    Status  New    Target Date  09/12/18      PT LONG TERM GOAL #4   Title  full cervical ROM so patient is able to look behind her without difficulty    Time  4    Period  Weeks    Status  New    Target Date  09/12/18      PT LONG TERM GOAL #5   Title  after work her neck and shoulder pain decreased >/= 75% due to improve strength and reduction in compensation    Time  8    Period  Weeks    Status  New    Target Date  09/12/18            Plan - 07/22/18 0845    Clinical Impression Statement  Patient still needs tactile cues to work on inhibiting the right upper trap and rotating the right scapula correctly with overhead flexion and abduction. Patient continues to have  tightness in the upper thoracic area. Patient had trigger points in the right upper trap, cervical multifidi, levator scapulae and rhomboids. Patient pain level after treatment was 1/10. Patient will benefit from skilled therapy to improve mobility of cervical and right shoulder while strengthening the right upper extremity and correcting muscle imbalances.     Rehab Potential  Excellent    Clinical Impairments Affecting Rehab Potential  Breast cancer; bilateral masectomy; breast implants    PT Frequency  2x / week    PT Duration  8 weeks    PT Treatment/Interventions  Electrical Stimulation;Iontophoresis 4mg /ml Dexamethasone;Therapeutic activities;Therapeutic exercise;Neuromuscular re-education;Manual techniques;Patient/family education;Passive range of motion;Dry needling;Taping    PT Next Visit Plan  joint mobilization to cervical, thoracic, rib cage and right shoulder, soft tissue work to right scapula, cervical, along jaw, and pectoralis, dry needling to right pectoralis, interscapular strength    PT Home Exercise Plan  Access Code: [email or web address]     Consulted and Agree with Plan of Care  Patient       Patient will benefit from skilled therapeutic intervention in order to improve the following deficits and  impairments:  Pain, Increased fascial restricitons, Decreased mobility, Increased muscle spasms, Decreased activity tolerance, Decreased endurance, Decreased range of motion, Decreased strength  Visit Diagnosis: Cervicalgia  Chronic right shoulder pain  Muscle weakness (generalized)     Problem List Patient Active Problem List   Diagnosis Date Noted  . Breast cancer, right breast ([place]) 08/02/2015  . S/P mastectomy 06/29/2015  . 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 Palpitations 01/21/2014  . Family history of breast cancer 10/02/2011    Earlie Counts, PT 07/22/18 9:34 AM   [facility] [address] [place], [place], [address] Phone: [phone]   Fax:  [phone]  Name: ZALEA PETE MRN: [id number] Date of Birth: 01/31/1966

## 2018-07-22 NOTE — Patient Instructions (Signed)

## 2018-07-25 ENCOUNTER — Ambulatory Visit: Payer: Commercial Managed Care - PPO | Admitting: Physical Therapy

## 2018-07-25 ENCOUNTER — Encounter: Payer: Self-pay | Admitting: Physical Therapy

## 2018-07-25 DIAGNOSIS — R252 Cramp and spasm: Secondary | ICD-10-CM

## 2018-07-25 DIAGNOSIS — M542 Cervicalgia: Secondary | ICD-10-CM | POA: Diagnosis not present

## 2018-07-25 DIAGNOSIS — M6281 Muscle weakness (generalized): Secondary | ICD-10-CM

## 2018-07-25 DIAGNOSIS — G8929 Other chronic pain: Secondary | ICD-10-CM | POA: Diagnosis not present

## 2018-07-25 DIAGNOSIS — M25511 Pain in right shoulder: Secondary | ICD-10-CM

## 2018-07-25 NOTE — Therapy (Signed)
[facility] [address] [place], [place], [address] Phone: [phone]   Fax:  [phone]  Physical Therapy Treatment  Patient Details  Name: Christina Logan MRN: [id number] Date of Birth: Mar 23, 1966 Referring Provider (PT): Dr. London Pepper   Encounter Date: 07/25/2018  PT End of Session - 07/25/18 1221    Visit Number  3    Date for PT Re-Evaluation  09/12/18    Authorization Type  [employer]    PT Start Time  0845    PT Stop Time  0930    PT Time Calculation (min)  45 min    Activity Tolerance  Patient tolerated treatment well;No increased pain    Behavior During Therapy  WFL for tasks assessed/performed       Past Medical History:  Diagnosis Date  . Allergic rhinitis   . Anxiety   . Depression   . Dysrhythmia    occ PVC's, PAC's  . PVCs (premature ventricular contractions)   . Ulcerative colitis [facility])     Past Surgical History:  Procedure Laterality Date  . BREAST IMPLANT EXCHANGE Bilateral 02/22/2016   Procedure: REVISION OF RECONSTRUCTIVE BILATERAL BREAST WITH SILCONE BREAST IMPLANT EXCHANGE;  Surgeon: Irene Limbo, MD;  Location: [place];  Service: Plastics;  Laterality: Bilateral;  . BREAST IMPLANT EXCHANGE Bilateral 04/03/2017   Procedure: REVISION bilateral  BREAST RECONSTRUCTION WITH SILICONE IMPLANT EXCHANGE AND ALLODERM;  Surgeon: Irene Limbo, MD;  Location: [place];  Service: Plastics;  Laterality: Bilateral;  . BREAST RECONSTRUCTION WITH PLACEMENT OF TISSUE EXPANDER AND FLEX HD (ACELLULAR HYDRATED DERMIS) Bilateral 06/29/2015   Procedure: BILATERAL BREAST RECONSTRUCTION WITH PLACEMENT OF TISSUE EXPANDER AND FLEX HD (ACELLULAR HYDRATED DERMIS);  Surgeon: Irene Limbo, MD;  Location: [place];  Service: Plastics;  Laterality: Bilateral;  . COLONOSCOPY     2011/2012  . laproscopic exam for fertility    . LIPOSUCTION WITH LIPOFILLING  Bilateral 09/28/2015   Procedure: LIPO FILLING FROM ABDOMEN TO BILATERAL CHEST;  Surgeon: Irene Limbo, MD;  Location: [place];  Service: Plastics;  Laterality: Bilateral;  . LIPOSUCTION WITH LIPOFILLING Bilateral 02/22/2016   Procedure: LIPOFILLING TO BILATERAL CHEST;  Surgeon: Irene Limbo, MD;  Location: [place];  Service: Plastics;  Laterality: Bilateral;  . NIPPLE SPARING MASTECTOMY/SENTINAL LYMPH NODE BIOPSY/RECONSTRUCTION/PLACEMENT OF TISSUE EXPANDER Bilateral 06/29/2015   Procedure: RIGHT NIPPLE SPARING MASTECTOMY WITH SENTINAL LYMPH NODE BIOPSY AND LEFT PROPHYLACTIC NIPPLE SPARING MASTECTOMY;  Surgeon: Rolm Bookbinder, MD;  Location: [place];  Service: General;  Laterality: Bilateral;  . REMOVAL OF BILATERAL TISSUE EXPANDERS WITH PLACEMENT OF BILATERAL BREAST IMPLANTS Bilateral 09/28/2015   Procedure: REMOVAL OF BILATERAL TISSUE EXPANDERS WITH PLACEMENT OF BILATERAL BREAST IMPLANTS;  Surgeon: Irene Limbo, MD;  Location: [place];  Service: Plastics;  Laterality: Bilateral;  . right foot surgery      There were no vitals filed for this visit.  Subjective Assessment - 07/25/18 0846    Subjective  The tape helped me keep my shoulder down. I have better range. I am not as tight in the right elbow. I am not compensating as much.     Patient Stated Goals  decrease pain    Currently in Pain?  Yes    Pain Score  2     Pain Location  Neck    Pain Orientation  Right    Pain Descriptors / Indicators  Heaviness    Pain Type  Chronic pain    Pain Onset  More than a month ago    Pain Frequency  Constant    Aggravating Factors   after work with using right arm, laying on right side, turning to look behind her both ways    Pain Relieving Factors  heat    Multiple Pain Sites  Yes    Pain Score  1    Pain Location  Shoulder    Pain Orientation  Right    Pain Descriptors / Indicators  Dull    Pain Type  Chronic  pain    Pain Onset  More than a month ago    Pain Frequency  Intermittent    Aggravating Factors   right shoulder movement    Pain Relieving Factors  keep arm at side         [facility] PT Assessment - 07/25/18 0001      AROM   Right Shoulder Flexion  162 Degrees    Right Shoulder ABduction  162 Degrees                   [facility] Adult PT Treatment/Exercise - 07/25/18 0001      Shoulder Exercises: Prone   Horizontal ABduction 1  Strengthening;Both;10 reps   laying on green physioball on toes   Other Prone Exercises  Prone on green ball bil. shoulder Y movement wiht tactile cues to rotate scapula and on toes to engage the core    Other Prone Exercises  childs pose and go all three directions; thighs on foam roller moving back and forth to work on midrange shoulder stabililty      Shoulder Exercises: Sidelying   Other Sidelying Exercises  lay on side and move arns into horizontal abduction then diagonals for rotation of thoracic and stretch pectoralis      Shoulder Exercises: Standing   Other Standing Exercises  roll ball upward on  wall 10x      Shoulder Exercises: ROM/Strengthening   Wall Pushups  10 reps    Wall Pushups Limitations  using red physic ball, VC to tighten core    Other ROM/Strengthening Exercises  The UE ranger for right shoulder flexion at 24 with tactile cues to relax the upper trap; right shoulder abduction level 24 with tactile cues to relax the upper trap      Manual Therapy   Manual Therapy  Soft tissue mobilization    Soft tissue mobilization  right pectoralis, right SCM, upper trap, pectoralis       Trigger Point Dry Needling - 07/25/18 0917    Consent Given?  Yes    Muscles Treated Upper Body  Upper trapezius;Pectoralis major;Pectoralis minor;Subscapularis;Sternocleidomastoid   right   Upper Trapezius Response  Twitch reponse elicited;Palpable increased muscle length   right lateral pterygoid    Pectoralis Major Response  Twitch response  elicited;Palpable increased muscle length    Pectoralis Minor Response  Twitch response elicited;Palpable increased muscle length    Subscapularis Response  Twitch response elicited;Palpable increased muscle length             PT Short Term Goals - 07/25/18 1222      PT SHORT TERM GOAL #1   Title  independent with HEP    Time  4    Period  Weeks    Status  Achieved      PT SHORT TERM GOAL #2   Title  abilty to use right arm with pain level decreased >/= 25%  Time  4    Period  Weeks    Status  On-going      PT SHORT TERM GOAL #3   Title  full mobility of rib cage with breath and right shoulder ROM due to reduction in tissue tightness    Time  4    Period  Weeks    Status  On-going      PT SHORT TERM GOAL #4   Title  full cervical ROM due to reduction in restrictions    Time  4    Period  Weeks    Status  On-going        PT Long Term Goals - 07/18/18 1002      PT LONG TERM GOAL #1   Title  independent with HEP and understand how to progress herself    Time  8    Period  Weeks    Status  New    Target Date  09/12/18      PT LONG TERM GOAL #2   Title  abitly to use right arm with minimal to no difficulty due to strength 5/5    Time  8    Period  Weeks    Status  New    Target Date  09/12/18      PT LONG TERM GOAL #3   Title  ability to lift items with right arm without tightening her cervical muscles due to reduction in compensation    Time  8    Period  Weeks    Status  New    Target Date  09/12/18      PT LONG TERM GOAL #4   Title  full cervical ROM so patient is able to look behind her without difficulty    Time  4    Period  Weeks    Status  New    Target Date  09/12/18      PT LONG TERM GOAL #5   Title  after work her neck and shoulder pain decreased >/= 75% due to improve strength and reduction in compensation    Time  8    Period  Weeks    Status  New    Target Date  09/12/18            Plan - 07/25/18 0901    Clinical  Impression Statement  Patient needs less verbal and tactile cues with her interscapular exercies. Patient had increased tissue mobility of the right pectoralis and SCM. Patient has increased right shoulder flexion and abduction for A/ROM. Patient able to relax the right upper trap better with right shoulder abduction. Patietn will benefit from skilled therapy to improve mobility of cervical and right shoulder while strengthening the right upper extremity and correcting muscle imbalances.     Rehab Potential  Excellent    Clinical Impairments Affecting Rehab Potential  Breast cancer; bilateral masectomy; breast implants    PT Frequency  2x / week    PT Duration  8 weeks    PT Next Visit Plan  joint mobilization to cervical, thoracic, rib cage and right shoulder, soft tissue work to right scapula, cervical, along jaw, and pectoralis, HEP with interscapular and ball, interscapular strength    PT Home Exercise Plan  Access Code: [email or web address]     Recommended Other Services  MD signed initial eval    Consulted and Agree with Plan of Care  Patient       Patient will benefit from skilled therapeutic intervention  in order to improve the following deficits and impairments:  Pain, Increased fascial restricitons, Decreased mobility, Increased muscle spasms, Decreased activity tolerance, Decreased endurance, Decreased range of motion, Decreased strength  Visit Diagnosis: Cervicalgia  Chronic right shoulder pain  Muscle weakness (generalized)  Cramp and spasm     Problem List Patient Active Problem List   Diagnosis Date Noted  . Breast cancer, right breast ([place]) 08/02/2015  . S/P mastectomy 06/29/2015  . 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 Palpitations 01/21/2014  . Family history of breast cancer 10/02/2011    Christina Logan, PT 07/25/18 12:23 PM   [facility] [address], [place], [place], [address] Phone:  [phone]   Fax:  [phone]  Name: Christina Logan MRN: [id number] Date of Birth: January 19, 1966

## 2018-07-29 ENCOUNTER — Ambulatory Visit: Payer: Commercial Managed Care - PPO | Admitting: Physical Therapy

## 2018-07-29 ENCOUNTER — Encounter: Payer: Self-pay | Admitting: Physical Therapy

## 2018-07-29 DIAGNOSIS — M542 Cervicalgia: Secondary | ICD-10-CM | POA: Diagnosis not present

## 2018-07-29 DIAGNOSIS — G8929 Other chronic pain: Secondary | ICD-10-CM

## 2018-07-29 DIAGNOSIS — M25511 Pain in right shoulder: Secondary | ICD-10-CM

## 2018-07-29 DIAGNOSIS — R252 Cramp and spasm: Secondary | ICD-10-CM

## 2018-07-29 DIAGNOSIS — M6281 Muscle weakness (generalized): Secondary | ICD-10-CM

## 2018-07-29 NOTE — Therapy (Signed)
[facility] [address] [place], [place], [address] Phone: [phone]   Fax:  [phone]  Physical Therapy Treatment  Patient Details  Name: Christina Logan MRN: [id number] Date of Birth: 1965/06/23 Referring Provider (PT): Dr. London Pepper   Encounter Date: 07/29/2018  PT End of Session - 07/29/18 0928    Visit Number  4    Date for PT Re-Evaluation  09/12/18    Authorization Type  UHC    PT Start Time  0845    PT Stop Time  0928    PT Time Calculation (min)  43 min    Activity Tolerance  Patient tolerated treatment well;No increased pain    Behavior During Therapy  WFL for tasks assessed/performed       Past Medical History:  Diagnosis Date  . Allergic rhinitis   . Anxiety   . Depression   . Dysrhythmia    occ PVC's, PAC's  . PVCs (premature ventricular contractions)   . Ulcerative colitis [facility])     Past Surgical History:  Procedure Laterality Date  . BREAST IMPLANT EXCHANGE Bilateral 02/22/2016   Procedure: REVISION OF RECONSTRUCTIVE BILATERAL BREAST WITH SILCONE BREAST IMPLANT EXCHANGE;  Surgeon: Irene Limbo, MD;  Location: [place];  Service: Plastics;  Laterality: Bilateral;  . BREAST IMPLANT EXCHANGE Bilateral 04/03/2017   Procedure: REVISION bilateral  BREAST RECONSTRUCTION WITH SILICONE IMPLANT EXCHANGE AND ALLODERM;  Surgeon: Irene Limbo, MD;  Location: [place];  Service: Plastics;  Laterality: Bilateral;  . BREAST RECONSTRUCTION WITH PLACEMENT OF TISSUE EXPANDER AND FLEX HD (ACELLULAR HYDRATED DERMIS) Bilateral 06/29/2015   Procedure: BILATERAL BREAST RECONSTRUCTION WITH PLACEMENT OF TISSUE EXPANDER AND FLEX HD (ACELLULAR HYDRATED DERMIS);  Surgeon: Irene Limbo, MD;  Location: [place];  Service: Plastics;  Laterality: Bilateral;  . COLONOSCOPY     2011/2012  . laproscopic exam for fertility    . LIPOSUCTION WITH LIPOFILLING  Bilateral 09/28/2015   Procedure: LIPO FILLING FROM ABDOMEN TO BILATERAL CHEST;  Surgeon: Irene Limbo, MD;  Location: [place];  Service: Plastics;  Laterality: Bilateral;  . LIPOSUCTION WITH LIPOFILLING Bilateral 02/22/2016   Procedure: LIPOFILLING TO BILATERAL CHEST;  Surgeon: Irene Limbo, MD;  Location: [place];  Service: Plastics;  Laterality: Bilateral;  . NIPPLE SPARING MASTECTOMY/SENTINAL LYMPH NODE BIOPSY/RECONSTRUCTION/PLACEMENT OF TISSUE EXPANDER Bilateral 06/29/2015   Procedure: RIGHT NIPPLE SPARING MASTECTOMY WITH SENTINAL LYMPH NODE BIOPSY AND LEFT PROPHYLACTIC NIPPLE SPARING MASTECTOMY;  Surgeon: Rolm Bookbinder, MD;  Location: [place];  Service: General;  Laterality: Bilateral;  . REMOVAL OF BILATERAL TISSUE EXPANDERS WITH PLACEMENT OF BILATERAL BREAST IMPLANTS Bilateral 09/28/2015   Procedure: REMOVAL OF BILATERAL TISSUE EXPANDERS WITH PLACEMENT OF BILATERAL BREAST IMPLANTS;  Surgeon: Irene Limbo, MD;  Location: [place];  Service: Plastics;  Laterality: Bilateral;  . right foot surgery      There were no vitals filed for this visit.  Subjective Assessment - 07/29/18 0850    Subjective  I did more stuff around the house and experienced increased tightness.     Patient Stated Goals  decrease pain    Currently in Pain?  Yes    Pain Score  3     Pain Location  Neck    Pain Orientation  Right    Pain Descriptors / Indicators  Heaviness    Pain Type  Chronic pain    Pain Onset  More than a month ago  Pain Frequency  Constant    Aggravating Factors   afer work with using right arm, laying on right side, turning to look behind her both ways    Pain Relieving Factors  heat         [facility] PT Assessment - 07/29/18 0001      AROM   Cervical - Right Rotation  70    Cervical - Left Rotation  70                   [facility] Adult PT Treatment/Exercise - 07/29/18 0001      Shoulder  Exercises: Supine   Other Supine Exercises  laying on foam roll with pectoralis stretch, alternate shoulder flextion, snow angels, bilateral shoulder flexion      Shoulder Exercises: Prone   Horizontal ABduction 1  Strengthening;Both;10 reps   laying on green physioball on toes   Other Prone Exercises  Prone on green ball bil. shoulder Y movement wiht tactile cues to rotate scapula and on toes to engage the core    Other Prone Exercises  childs pose and go all three directions; elbow on the ball working in midrange to engage the abdominals      Shoulder Exercises: Sidelying   Other Sidelying Exercises  lay on side and move arns into horizontal abduction then diagonals for rotation of thoracic and stretch pectoralis      Manual Therapy   Manual Therapy  Soft tissue mobilization;Joint mobilization    Joint Mobilization  sideglide to Cervical C3-C5    Soft tissue mobilization  lateral pterygoid, masseter, scalenes, SCM, auditory meatus, subocciptials             PT Education - 07/29/18 0913    Education Details  Access Code: [email or web address]     Person(s) Educated  Patient    Methods  Explanation;Demonstration;Verbal cues;Handout    Comprehension  Returned demonstration;Verbalized understanding       PT Short Term Goals - 07/29/18 0854      PT SHORT TERM GOAL #1   Title  independent with HEP    Time  4    Period  Weeks    Status  Achieved      PT SHORT TERM GOAL #2   Title  abilty to use right arm with pain level decreased >/= 25%    Baseline  20%    Time  4    Period  Weeks    Status  On-going      PT SHORT TERM GOAL #3   Title  full mobility of rib cage with breath and right shoulder ROM due to reduction in tissue tightness    Time  4    Period  Weeks    Status  On-going      PT SHORT TERM GOAL #4   Title  full cervical ROM due to reduction in restrictions    Time  4    Period  Weeks    Status  On-going        PT Long Term Goals - 07/18/18 1002      PT LONG  TERM GOAL #1   Title  independent with HEP and understand how to progress herself    Time  8    Period  Weeks    Status  New    Target Date  09/12/18      PT LONG TERM GOAL #2   Title  abitly to use right arm with minimal to no difficulty due  to strength 5/5    Time  8    Period  Weeks    Status  New    Target Date  09/12/18      PT LONG TERM GOAL #3   Title  ability to lift items with right arm without tightening her cervical muscles due to reduction in compensation    Time  8    Period  Weeks    Status  New    Target Date  09/12/18      PT LONG TERM GOAL #4   Title  full cervical ROM so patient is able to look behind her without difficulty    Time  4    Period  Weeks    Status  New    Target Date  09/12/18      PT LONG TERM GOAL #5   Title  after work her neck and shoulder pain decreased >/= 75% due to improve strength and reduction in compensation    Time  8    Period  Weeks    Status  New    Target Date  09/12/18            Plan - 07/29/18 0856    Clinical Impression Statement  Patient has increased cervical rotation to 70 degrees bilateral. Patient is able to turn her head with 20% greater ease. Patient is able to perform prone shoulder exercises with correct movement. Patient is able to fully  flex her shoulders supine on the bolster. Patient is ready for scapular strength prone on ball at home. Patient continues to have tightness in the cervical and shoulder muscles. Patient will benefit from skilled therapy to improve mobility of cervical and right shoulder while strengthening the right upper extremity and correcting muscle imbalances.     Rehab Potential  Excellent    Clinical Impairments Affecting Rehab Potential  Breast cancer; bilateral masectomy; breast implants    PT Frequency  2x / week    PT Duration  8 weeks    PT Treatment/Interventions  Electrical Stimulation;Iontophoresis 4mg /ml Dexamethasone;Therapeutic activities;Therapeutic exercise;Neuromuscular  re-education;Manual techniques;Patient/family education;Passive range of motion;Dry needling;Taping    PT Next Visit Plan  joint mobilization to cervical, thoracic, rib cage and right shoulder, soft tissue work to right scapula,  and pectoralis, interscapular strength    PT Home Exercise Plan  Access Code: [email or web address]     Consulted and Agree with Plan of Care  Patient       Patient will benefit from skilled therapeutic intervention in order to improve the following deficits and impairments:  Pain, Increased fascial restricitons, Decreased mobility, Increased muscle spasms, Decreased activity tolerance, Decreased endurance, Decreased range of motion, Decreased strength  Visit Diagnosis: Cervicalgia  Chronic right shoulder pain  Muscle weakness (generalized)  Cramp and spasm     Problem List Patient Active Problem List   Diagnosis Date Noted  . Breast cancer, right breast ([place]) 08/02/2015  . S/P mastectomy 06/29/2015  . 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 Palpitations 01/21/2014  . Family history of breast cancer 10/02/2011    Earlie Counts, PT 07/29/18 9:31 AM   [facility] [address], [place], [place], [address] Phone: [phone]   Fax:  [phone]  Name: Christina Logan MRN: [id number] Date of Birth: 10-09-1965

## 2018-07-29 NOTE — Patient Instructions (Signed)
Access Code: [email or web address]  URL: [email or web address]  Date: 07/29/2018  Prepared by: Earlie Counts   Exercises  Standing Ulnar Nerve Glide - 5 reps - 1 sets - 2x daily - 7x weekly  Standing Median Nerve Glide - 5 reps - 1 sets - 2x daily - 7x weekly  Quadruped Full Range Thoracic Rotation with Reach - 2 reps - 1 sets - 5 sec hold - 1x daily - 7x weekly  Open Book Chest Stretch on Towel Roll - 1 reps - 1 sets - 15 sec hold - 1x daily - 7x weekly  Supine Thoracic Mobilization Towel Roll Vertical with Arm Stretch - 1 reps - 1 sets - 15 sec hold - 1x daily - 7x weekly  Prone Shoulder Horizontal Abduction on Swiss Ball - 10 reps - 1 sets - 1x daily - 7x weekly  Prone Chest Stretch on Chair - 2 reps - 1 sets - 30 sec hold - 1x daily - 7x weekly  Prone Lower Trapezius with Legs Straight on Swiss Ball - 10 reps - 1 sets - 1x daily - 7x weekly  [facility] [address], [place], [place] [address] Phone # [phone] Fax [phone]

## 2018-07-30 DIAGNOSIS — D0510 Intraductal carcinoma in situ of unspecified breast: Secondary | ICD-10-CM | POA: Diagnosis not present

## 2018-08-05 ENCOUNTER — Encounter: Payer: Commercial Managed Care - PPO | Admitting: Physical Therapy

## 2018-08-06 ENCOUNTER — Encounter: Payer: Self-pay | Admitting: Physical Therapy

## 2018-08-06 ENCOUNTER — Ambulatory Visit: Payer: Commercial Managed Care - PPO | Attending: Family Medicine | Admitting: Physical Therapy

## 2018-08-06 DIAGNOSIS — M6281 Muscle weakness (generalized): Secondary | ICD-10-CM | POA: Diagnosis present

## 2018-08-06 DIAGNOSIS — M542 Cervicalgia: Secondary | ICD-10-CM | POA: Diagnosis not present

## 2018-08-06 DIAGNOSIS — R252 Cramp and spasm: Secondary | ICD-10-CM | POA: Diagnosis present

## 2018-08-06 DIAGNOSIS — M25511 Pain in right shoulder: Secondary | ICD-10-CM | POA: Diagnosis not present

## 2018-08-06 DIAGNOSIS — G8929 Other chronic pain: Secondary | ICD-10-CM | POA: Diagnosis not present

## 2018-08-06 NOTE — Therapy (Signed)
[facility] [address], [place], [place], [address] Phone: [phone]   Fax:  [phone]  Physical Therapy Treatment  Patient Details  Name: Christina Logan MRN: [id number] Date of Birth: August 08, 1965 Referring Provider (PT): Dr. London Pepper   Encounter Date: 08/06/2018  PT End of Session - 08/06/18 1014    Visit Number  5    Date for PT Re-Evaluation  09/12/18    Authorization Type  [employer]    PT Start Time  0930    PT Stop Time  8338    PT Time Calculation (min)  44 min    Activity Tolerance  Patient tolerated treatment well;No increased pain    Behavior During Therapy  WFL for tasks assessed/performed       Past Medical History:  Diagnosis Date  . Allergic rhinitis   . Anxiety   . Depression   . Dysrhythmia    occ PVC's, PAC's  . PVCs (premature ventricular contractions)   . Ulcerative colitis [facility])     Past Surgical History:  Procedure Laterality Date  . BREAST IMPLANT EXCHANGE Bilateral 02/22/2016   Procedure: REVISION OF RECONSTRUCTIVE BILATERAL BREAST WITH SILCONE BREAST IMPLANT EXCHANGE;  Surgeon: Irene Limbo, MD;  Location: [place];  Service: Plastics;  Laterality: Bilateral;  . BREAST IMPLANT EXCHANGE Bilateral 04/03/2017   Procedure: REVISION bilateral  BREAST RECONSTRUCTION WITH SILICONE IMPLANT EXCHANGE AND ALLODERM;  Surgeon: Irene Limbo, MD;  Location: [place];  Service: Plastics;  Laterality: Bilateral;  . BREAST RECONSTRUCTION WITH PLACEMENT OF TISSUE EXPANDER AND FLEX HD (ACELLULAR HYDRATED DERMIS) Bilateral 06/29/2015   Procedure: BILATERAL BREAST RECONSTRUCTION WITH PLACEMENT OF TISSUE EXPANDER AND FLEX HD (ACELLULAR HYDRATED DERMIS);  Surgeon: Irene Limbo, MD;  Location: [place];  Service: Plastics;  Laterality: Bilateral;  . COLONOSCOPY     2011/2012  . laproscopic exam for fertility    . LIPOSUCTION WITH LIPOFILLING  Bilateral 09/28/2015   Procedure: LIPO FILLING FROM ABDOMEN TO BILATERAL CHEST;  Surgeon: Irene Limbo, MD;  Location: [place];  Service: Plastics;  Laterality: Bilateral;  . LIPOSUCTION WITH LIPOFILLING Bilateral 02/22/2016   Procedure: LIPOFILLING TO BILATERAL CHEST;  Surgeon: Irene Limbo, MD;  Location: [place];  Service: Plastics;  Laterality: Bilateral;  . NIPPLE SPARING MASTECTOMY/SENTINAL LYMPH NODE BIOPSY/RECONSTRUCTION/PLACEMENT OF TISSUE EXPANDER Bilateral 06/29/2015   Procedure: RIGHT NIPPLE SPARING MASTECTOMY WITH SENTINAL LYMPH NODE BIOPSY AND LEFT PROPHYLACTIC NIPPLE SPARING MASTECTOMY;  Surgeon: Rolm Bookbinder, MD;  Location: [place];  Service: General;  Laterality: Bilateral;  . REMOVAL OF BILATERAL TISSUE EXPANDERS WITH PLACEMENT OF BILATERAL BREAST IMPLANTS Bilateral 09/28/2015   Procedure: REMOVAL OF BILATERAL TISSUE EXPANDERS WITH PLACEMENT OF BILATERAL BREAST IMPLANTS;  Surgeon: Irene Limbo, MD;  Location: [place];  Service: Plastics;  Laterality: Bilateral;  . right foot surgery      There were no vitals filed for this visit.  Subjective Assessment - 08/06/18 0934    Subjective  I still have tightness in the right shoulder. I am not compensating as much when I am using my right shoulder. The tape helps.     Patient Stated Goals  decrease pain    Currently in Pain?  Yes    Pain Score  3     Pain Location  Neck    Pain Orientation  Right    Pain Descriptors / Indicators  Heaviness    Pain Type  Chronic pain  Pain Onset  More than a month ago    Pain Frequency  Constant    Aggravating Factors   after work with using right arm, laying on right side, turning to look behind her both ways    Pain Relieving Factors  heat    Multiple Pain Sites  Yes    Pain Location  Shoulder    Pain Orientation  Right    Pain Descriptors / Indicators  Dull    Pain Type  Chronic pain    Pain Onset  More  than a month ago    Pain Frequency  Intermittent    Aggravating Factors   right shoulder movement    Pain Relieving Factors  keep arm at side         [facility] PT Assessment - 08/06/18 0001      AROM   Cervical Extension  30    Cervical - Right Side Bend  20    Cervical - Left Side Bend  30    Cervical - Right Rotation  70    Cervical - Left Rotation  70                   [facility] Adult PT Treatment/Exercise - 08/06/18 0001      Shoulder Exercises: Supine   Other Supine Exercises  laying on foam roll with pectoralis stretch,     Other Supine Exercises  lay on foam roll with chest press 3# each hand, alternate shoulder flexion with 3# guiding shoulder; shoulder protraction/retraction with 3#;       Shoulder Exercises: Standing   Other Standing Exercises  lat bar 20# 10x2 with therapist guiding scapula      Shoulder Exercises: ROM/Strengthening   Other ROM/Strengthening Exercises  The UE ranger for right shoulder flexion at 24 with tactile cues to relax the upper trap; right shoulder abduction level 24 with tactile cues to relax the upper trap      Manual Therapy   Manual Therapy  Soft tissue mobilization;Taping    Soft tissue mobilization  right pectoralis and subscapularis    McConnell  inhibit right upper trap       Trigger Point Dry Needling - 08/06/18 0001    Consent Given?  Yes    Muscles Treated Upper Quadrant  Pectoralis major    Pectoralis Major Response  Twitch response elicited;Palpable increased muscle length             PT Short Term Goals - 08/06/18 1017      PT SHORT TERM GOAL #1   Title  independent with HEP    Time  4    Period  Weeks    Status  Achieved      PT SHORT TERM GOAL #2   Title  abilty to use right arm with pain level decreased >/= 25%    Time  4    Period  Weeks    Status  Achieved      PT SHORT TERM GOAL #3   Title  full mobility of rib cage with breath and right shoulder ROM due to reduction in tissue tightness     Time  4    Period  Weeks    Status  Achieved      PT SHORT TERM GOAL #4   Title  full cervical ROM due to reduction in restrictions    Time  4    Period  Weeks    Status  On-going  PT Long Term Goals - 07/18/18 1002      PT LONG TERM GOAL #1   Title  independent with HEP and understand how to progress herself    Time  8    Period  Weeks    Status  New    Target Date  09/12/18      PT LONG TERM GOAL #2   Title  abitly to use right arm with minimal to no difficulty due to strength 5/5    Time  8    Period  Weeks    Status  New    Target Date  09/12/18      PT LONG TERM GOAL #3   Title  ability to lift items with right arm without tightening her cervical muscles due to reduction in compensation    Time  8    Period  Weeks    Status  New    Target Date  09/12/18      PT LONG TERM GOAL #4   Title  full cervical ROM so patient is able to look behind her without difficulty    Time  4    Period  Weeks    Status  New    Target Date  09/12/18      PT LONG TERM GOAL #5   Title  after work her neck and shoulder pain decreased >/= 75% due to improve strength and reduction in compensation    Time  8    Period  Weeks    Status  New    Target Date  09/12/18            Plan - 08/06/18 0948    Clinical Impression Statement  Patient has less trigger point in the right upper trap and pectoralis muscle. Patient still needs tactile cues to not elevate the right scapula with shoulder exercises. Patient has increased cervical ROM. Patient will benefit from skilled therapy to improve mobiliyt of cervical and right shoulder while strengthening the right upper extremity and correcting muscle imbalances.     Rehab Potential  Excellent    Clinical Impairments Affecting Rehab Potential  Breast cancer; bilateral masectomy; breast implants    PT Frequency  2x / week    PT Duration  8 weeks    PT Treatment/Interventions  Electrical Stimulation;Iontophoresis 4mg /ml  Dexamethasone;Therapeutic activities;Therapeutic exercise;Neuromuscular re-education;Manual techniques;Patient/family education;Passive range of motion;Dry needling;Taping    PT Next Visit Plan  right shoulder strength with working on muscle imbalances; maybe tape the upper    PT Home Exercise Plan  Access Code: [email or web address]     Consulted and Agree with Plan of Care  Patient       Patient will benefit from skilled therapeutic intervention in order to improve the following deficits and impairments:  Pain, Increased fascial restricitons, Decreased mobility, Increased muscle spasms, Decreased activity tolerance, Decreased endurance, Decreased range of motion, Decreased strength  Visit Diagnosis: Cervicalgia  Chronic right shoulder pain  Muscle weakness (generalized)  Cramp and spasm     Problem List Patient Active Problem List   Diagnosis Date Noted  . Breast cancer, right breast ([place]) 08/02/2015  . S/P mastectomy 06/29/2015  . 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 Palpitations 01/21/2014  . Family history of breast cancer 10/02/2011    Earlie Counts, PT 08/06/18 10:18 AM   [facility] [address], [place], [place], [address] Phone: [phone]   Fax:  [phone]  Name: Christina Logan MRN: [id number] Date of Birth: Feb 06, 1966

## 2018-08-08 ENCOUNTER — Encounter: Payer: Self-pay | Admitting: Physical Therapy

## 2018-08-08 ENCOUNTER — Ambulatory Visit: Payer: Commercial Managed Care - PPO | Admitting: Physical Therapy

## 2018-08-08 DIAGNOSIS — M25511 Pain in right shoulder: Secondary | ICD-10-CM | POA: Diagnosis not present

## 2018-08-08 DIAGNOSIS — M542 Cervicalgia: Secondary | ICD-10-CM | POA: Diagnosis not present

## 2018-08-08 DIAGNOSIS — R252 Cramp and spasm: Secondary | ICD-10-CM

## 2018-08-08 DIAGNOSIS — G8929 Other chronic pain: Secondary | ICD-10-CM

## 2018-08-08 DIAGNOSIS — M6281 Muscle weakness (generalized): Secondary | ICD-10-CM

## 2018-08-08 NOTE — Therapy (Signed)
[facility] [address], [place], [place], [address] Phone: [phone]   Fax:  [phone]  Physical Therapy Treatment  Patient Details  Name: Christina Logan MRN: [id number] Date of Birth: 1965-10-03 Referring Provider (PT): Dr. London Pepper   Encounter Date: 08/08/2018  PT End of Session - 08/08/18 1006    Visit Number  6    Date for PT Re-Evaluation  09/12/18    Authorization Type  [employer]    PT Start Time  0930    PT Stop Time  1012    PT Time Calculation (min)  42 min    Activity Tolerance  Patient tolerated treatment well;No increased pain    Behavior During Therapy  WFL for tasks assessed/performed       Past Medical History:  Diagnosis Date  . Allergic rhinitis   . Anxiety   . Depression   . Dysrhythmia    occ PVC's, PAC's  . PVCs (premature ventricular contractions)   . Ulcerative colitis [facility])     Past Surgical History:  Procedure Laterality Date  . BREAST IMPLANT EXCHANGE Bilateral 02/22/2016   Procedure: REVISION OF RECONSTRUCTIVE BILATERAL BREAST WITH SILCONE BREAST IMPLANT EXCHANGE;  Surgeon: Irene Limbo, MD;  Location: [place];  Service: Plastics;  Laterality: Bilateral;  . BREAST IMPLANT EXCHANGE Bilateral 04/03/2017   Procedure: REVISION bilateral  BREAST RECONSTRUCTION WITH SILICONE IMPLANT EXCHANGE AND ALLODERM;  Surgeon: Irene Limbo, MD;  Location: [place];  Service: Plastics;  Laterality: Bilateral;  . BREAST RECONSTRUCTION WITH PLACEMENT OF TISSUE EXPANDER AND FLEX HD (ACELLULAR HYDRATED DERMIS) Bilateral 06/29/2015   Procedure: BILATERAL BREAST RECONSTRUCTION WITH PLACEMENT OF TISSUE EXPANDER AND FLEX HD (ACELLULAR HYDRATED DERMIS);  Surgeon: Irene Limbo, MD;  Location: [place];  Service: Plastics;  Laterality: Bilateral;  . COLONOSCOPY     2011/2012  . laproscopic exam for fertility    . LIPOSUCTION WITH LIPOFILLING  Bilateral 09/28/2015   Procedure: LIPO FILLING FROM ABDOMEN TO BILATERAL CHEST;  Surgeon: Irene Limbo, MD;  Location: [place];  Service: Plastics;  Laterality: Bilateral;  . LIPOSUCTION WITH LIPOFILLING Bilateral 02/22/2016   Procedure: LIPOFILLING TO BILATERAL CHEST;  Surgeon: Irene Limbo, MD;  Location: [place];  Service: Plastics;  Laterality: Bilateral;  . NIPPLE SPARING MASTECTOMY/SENTINAL LYMPH NODE BIOPSY/RECONSTRUCTION/PLACEMENT OF TISSUE EXPANDER Bilateral 06/29/2015   Procedure: RIGHT NIPPLE SPARING MASTECTOMY WITH SENTINAL LYMPH NODE BIOPSY AND LEFT PROPHYLACTIC NIPPLE SPARING MASTECTOMY;  Surgeon: Rolm Bookbinder, MD;  Location: [place];  Service: General;  Laterality: Bilateral;  . REMOVAL OF BILATERAL TISSUE EXPANDERS WITH PLACEMENT OF BILATERAL BREAST IMPLANTS Bilateral 09/28/2015   Procedure: REMOVAL OF BILATERAL TISSUE EXPANDERS WITH PLACEMENT OF BILATERAL BREAST IMPLANTS;  Surgeon: Irene Limbo, MD;  Location: [place];  Service: Plastics;  Laterality: Bilateral;  . right foot surgery      There were no vitals filed for this visit.  Subjective Assessment - 08/08/18 0933    Subjective  I have a tender spot on the right interscapular area. I am not sure the tape has helped as much.     Patient Stated Goals  decrease pain    Currently in Pain?  Yes    Pain Score  4     Pain Location  --   right interscapular   Pain Orientation  Right    Pain Descriptors / Indicators  Sharp;Stabbing    Pain Type  Acute pain  Pain Onset  More than a month ago    Pain Frequency  Constant    Aggravating Factors   worse from therapy last visit    Pain Relieving Factors  heat    Multiple Pain Sites  No                       [facility] Adult PT Treatment/Exercise - 08/08/18 0001      Shoulder Exercises: Prone   Other Prone Exercises  childs pose; quadruped scapula retraction/protraction, weight  shifting side to side; pull the right arm sideways with yellow band then diagonals      Manual Therapy   Manual Therapy  Joint mobilization;Soft tissue mobilization    Joint Mobilization  upslide glide to C3-5; right rib cage mobilization, PA mobilization to T3-T8    Soft tissue mobilization  right interscapular, right RTC and cervical paraspinlas       Trigger Point Dry Needling - 08/08/18 0001    Consent Given?  Yes    Muscles Treated Upper Quadrant  Infraspinatus;Rhomboids;Subscapularis    Rhomboids Response  Twitch response elicited;Palpable increased muscle length    Infraspinatus Response  Twitch response elicited;Palpable increased muscle length    Subscapularis Response  Twitch response elicited;Palpable increased muscle length             PT Short Term Goals - 08/06/18 1017      PT SHORT TERM GOAL #1   Title  independent with HEP    Time  4    Period  Weeks    Status  Achieved      PT SHORT TERM GOAL #2   Title  abilty to use right arm with pain level decreased >/= 25%    Time  4    Period  Weeks    Status  Achieved      PT SHORT TERM GOAL #3   Title  full mobility of rib cage with breath and right shoulder ROM due to reduction in tissue tightness    Time  4    Period  Weeks    Status  Achieved      PT SHORT TERM GOAL #4   Title  full cervical ROM due to reduction in restrictions    Time  4    Period  Weeks    Status  On-going        PT Long Term Goals - 07/18/18 1002      PT LONG TERM GOAL #1   Title  independent with HEP and understand how to progress herself    Time  8    Period  Weeks    Status  New    Target Date  09/12/18      PT LONG TERM GOAL #2   Title  abitly to use right arm with minimal to no difficulty due to strength 5/5    Time  8    Period  Weeks    Status  New    Target Date  09/12/18      PT LONG TERM GOAL #3   Title  ability to lift items with right arm without tightening her cervical muscles due to reduction in  compensation    Time  8    Period  Weeks    Status  New    Target Date  09/12/18      PT LONG TERM GOAL #4   Title  full cervical ROM so patient is able to  look behind her without difficulty    Time  4    Period  Weeks    Status  New    Target Date  09/12/18      PT LONG TERM GOAL #5   Title  after work her neck and shoulder pain decreased >/= 75% due to improve strength and reduction in compensation    Time  8    Period  Weeks    Status  New    Target Date  09/12/18            Plan - 08/08/18 1006    Clinical Impression Statement  Patient has trigger points in the right interscapular, right subscapularis and infraspinatus. Patient has tightness in right upper quadrant. Patient continues to work the interscapular strength. Patient will benefit from skilled therapy to imrove mobility of cervical and right shoulder while strengthening the right upper extremity and correcting imbalances.     Rehab Potential  Excellent    Clinical Impairments Affecting Rehab Potential  Breast cancer; bilateral masectomy; breast implants    PT Frequency  2x / week    PT Duration  8 weeks    PT Treatment/Interventions  Electrical Stimulation;Iontophoresis 4mg /ml Dexamethasone;Therapeutic activities;Therapeutic exercise;Neuromuscular re-education;Manual techniques;Patient/family education;Passive range of motion;Dry needling;Taping    PT Next Visit Plan  right shoulder strength with working on muscle imbalances; wieghtbearing exercises to right upper extremity with stretch    PT Home Exercise Plan  Access Code: [email or web address]     Consulted and Agree with Plan of Care  Patient       Patient will benefit from skilled therapeutic intervention in order to improve the following deficits and impairments:  Pain, Increased fascial restricitons, Decreased mobility, Increased muscle spasms, Decreased activity tolerance, Decreased endurance, Decreased range of motion, Decreased strength  Visit  Diagnosis: Cervicalgia  Muscle weakness (generalized)  Cramp and spasm  Chronic right shoulder pain     Problem List Patient Active Problem List   Diagnosis Date Noted  . Breast cancer, right breast ([place]) 08/02/2015  . S/P mastectomy 06/29/2015  . 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 Palpitations 01/21/2014  . Family history of breast cancer 10/02/2011    Earlie Counts, PT 08/08/18 10:11 AM   [facility] [address] [place], [place], [address] Phone: [phone]   Fax:  [phone]  Name: Christina Logan MRN: [id number] Date of Birth: Oct 29, 1965

## 2018-08-12 ENCOUNTER — Encounter: Payer: Self-pay | Admitting: Physical Therapy

## 2018-08-12 ENCOUNTER — Ambulatory Visit: Payer: Commercial Managed Care - PPO | Admitting: Physical Therapy

## 2018-08-12 DIAGNOSIS — M542 Cervicalgia: Secondary | ICD-10-CM

## 2018-08-12 DIAGNOSIS — G8929 Other chronic pain: Secondary | ICD-10-CM | POA: Diagnosis not present

## 2018-08-12 DIAGNOSIS — R252 Cramp and spasm: Secondary | ICD-10-CM

## 2018-08-12 DIAGNOSIS — M25511 Pain in right shoulder: Secondary | ICD-10-CM

## 2018-08-12 DIAGNOSIS — M6281 Muscle weakness (generalized): Secondary | ICD-10-CM

## 2018-08-12 NOTE — Patient Instructions (Signed)
Access Code: [email or web address]  URL: [email or web address]  Date: 08/12/2018  Prepared by: Earlie Counts   Exercises  Standing Ulnar Nerve Glide - 5 reps - 1 sets - 2x daily - 7x weekly  Standing Median Nerve Glide - 5 reps - 1 sets - 2x daily - 7x weekly  Quadruped Full Range Thoracic Rotation with Reach - 2 reps - 1 sets - 5 sec hold - 1x daily - 7x weekly  Open Book Chest Stretch on Towel Roll - 1 reps - 1 sets - 15 sec hold - 1x daily - 7x weekly  Supine Thoracic Mobilization Towel Roll Vertical with Arm Stretch - 1 reps - 1 sets - 15 sec hold - 1x daily - 7x weekly  Prone Shoulder Horizontal Abduction on Swiss Ball - 10 reps - 1 sets - 1x daily - 7x weekly  Prone Chest Stretch on Chair - 2 reps - 1 sets - 30 sec hold - 1x daily - 7x weekly  Prone Lower Trapezius with Legs Straight on Swiss Ball - 10 reps - 1 sets - 1x daily - 7x weekly  Side Plank on Elbow - 5 reps - 1 sets - 15 sec hold - 1x daily - 7x weekly  Child's Pose with Thread the Needle - 2 reps - 1 sets - 15 sec hold - 1x daily - 7x weekly  [facility] [address], [place], [place] [address] Phone # [phone] Fax [phone]

## 2018-08-12 NOTE — Therapy (Addendum)
[facility] [address], [place], [place], [address] Phone: [phone]   Fax:  [phone]  Physical Therapy Treatment  Patient Details  Name: Christina Logan MRN: [id number] Date of Birth: [birth year] Referring Provider (PT): Dr. London Pepper   Encounter Date: 08/12/2018  PT End of Session - 08/12/18 1014    Visit Number  7    Date for PT Re-Evaluation  09/12/18    Authorization Type  [employer]    PT Start Time  0930    PT Stop Time  1013    PT Time Calculation (min)  43 min    Activity Tolerance  Patient tolerated treatment well;No increased pain    Behavior During Therapy  WFL for tasks assessed/performed       Past Medical History:  Diagnosis Date  . Allergic rhinitis   . Anxiety   . Depression   . Dysrhythmia    occ PVC's, PAC's  . PVCs (premature ventricular contractions)   . Ulcerative colitis [facility])     Past Surgical History:  Procedure Laterality Date  . BREAST IMPLANT EXCHANGE Bilateral 02/22/2016   Procedure: REVISION OF RECONSTRUCTIVE BILATERAL BREAST WITH SILCONE BREAST IMPLANT EXCHANGE;  Surgeon: Irene Limbo, MD;  Location: [place];  Service: Plastics;  Laterality: Bilateral;  . BREAST IMPLANT EXCHANGE Bilateral 04/03/2017   Procedure: REVISION bilateral  BREAST RECONSTRUCTION WITH SILICONE IMPLANT EXCHANGE AND ALLODERM;  Surgeon: Irene Limbo, MD;  Location: [place];  Service: Plastics;  Laterality: Bilateral;  . BREAST RECONSTRUCTION WITH PLACEMENT OF TISSUE EXPANDER AND FLEX HD (ACELLULAR HYDRATED DERMIS) Bilateral 06/29/2015   Procedure: BILATERAL BREAST RECONSTRUCTION WITH PLACEMENT OF TISSUE EXPANDER AND FLEX HD (ACELLULAR HYDRATED DERMIS);  Surgeon: Irene Limbo, MD;  Location: [place];  Service: Plastics;  Laterality: Bilateral;  . COLONOSCOPY     2011/2012  . laproscopic exam for fertility    . LIPOSUCTION WITH LIPOFILLING  Bilateral 09/28/2015   Procedure: LIPO FILLING FROM ABDOMEN TO BILATERAL CHEST;  Surgeon: Irene Limbo, MD;  Location: [place];  Service: Plastics;  Laterality: Bilateral;  . LIPOSUCTION WITH LIPOFILLING Bilateral 02/22/2016   Procedure: LIPOFILLING TO BILATERAL CHEST;  Surgeon: Irene Limbo, MD;  Location: [place];  Service: Plastics;  Laterality: Bilateral;  . NIPPLE SPARING MASTECTOMY/SENTINAL LYMPH NODE BIOPSY/RECONSTRUCTION/PLACEMENT OF TISSUE EXPANDER Bilateral 06/29/2015   Procedure: RIGHT NIPPLE SPARING MASTECTOMY WITH SENTINAL LYMPH NODE BIOPSY AND LEFT PROPHYLACTIC NIPPLE SPARING MASTECTOMY;  Surgeon: Rolm Bookbinder, MD;  Location: [place];  Service: General;  Laterality: Bilateral;  . REMOVAL OF BILATERAL TISSUE EXPANDERS WITH PLACEMENT OF BILATERAL BREAST IMPLANTS Bilateral 09/28/2015   Procedure: REMOVAL OF BILATERAL TISSUE EXPANDERS WITH PLACEMENT OF BILATERAL BREAST IMPLANTS;  Surgeon: Irene Limbo, MD;  Location: [place];  Service: Plastics;  Laterality: Bilateral;  . right foot surgery      There were no vitals filed for this visit.  Subjective Assessment - 08/12/18 0936    Subjective  I felt better after last treatment and pain is better. I am tight right now. Range of motion is better. The pain under the right scapula is better.     Patient Stated Goals  decrease pain    Currently in Pain?  Yes    Pain Score  2     Pain Location  --   right interscapular   Pain Orientation  Right    Pain Descriptors / Indicators  Sharp;Stabbing  Pain Type  Acute pain    Pain Onset  More than a month ago    Pain Frequency  Constant    Aggravating Factors   worse from therapy last visit    Pain Relieving Factors  heat    Multiple Pain Sites  No         [facility] PT Assessment - 08/12/18 0001      AROM   Thoracic - Left Rotation  full                   [facility] Adult PT Treatment/Exercise -  08/12/18 0001      Shoulder Exercises: Supine   Other Supine Exercises  laying on foam roll mobilization to the low thoracic area, and mid thoracic,       Shoulder Exercises: Sidelying   Other Sidelying Exercises  lay on side and reach forward then rotate backward with stabiilzing the pelvis,       Shoulder Exercises: Standing   Extension  Strengthening;Both;10 reps;Weights;Limitations    Extension Weight (lbs)  4   each hand   Extension Limitations  bent at hips; VC to push chest forward    Row  Strengthening;Both;10 reps;Weights   2 sets   Row Weight (lbs)  5   each arm   Row Limitations  bent at hips, VC to bring elbows to the spine    Shoulder Elevation  Strengthening;Both;10 reps;Seated   5# 2 sets of 10   Other Standing Exercises  upright rows with 5# each hand and tacile cues to rotated scapula    Other Standing Exercises  lat bar 25# 10x2 with therapist guiding scapula      Shoulder Exercises: ROM/Strengthening   Other ROM/Strengthening Exercises  The UE ranger for right shoulder flexion at 24 with tactile cues to relax the upper trap; right shoulder abduction level 24 with tactile cues to relax the upper trap      Manual Therapy   Manual Therapy  Joint mobilization    Joint Mobilization  P-A mobilization to T3-T10 grade 3             PT Education - 08/12/18 1013    Education Details  Access Code: [email or web address]     Person(s) Educated  Patient    Methods  Demonstration;Explanation;Verbal cues;Handout    Comprehension  Verbalized understanding;Returned demonstration       PT Short Term Goals - 08/06/18 1017      PT SHORT TERM GOAL #1   Title  independent with HEP    Time  4    Period  Weeks    Status  Achieved      PT SHORT TERM GOAL #2   Title  abilty to use right arm with pain level decreased >/= 25%    Time  4    Period  Weeks    Status  Achieved      PT SHORT TERM GOAL #3   Title  full mobility of rib cage with breath and right shoulder ROM due to  reduction in tissue tightness    Time  4    Period  Weeks    Status  Achieved      PT SHORT TERM GOAL #4   Title  full cervical ROM due to reduction in restrictions    Time  4    Period  Weeks    Status  On-going        PT Long Term Goals - 08/12/18 1016  PT LONG TERM GOAL #1   Title  independent with HEP and understand how to progress herself    Baseline  still learning    Time  8    Period  Weeks    Status  On-going      PT LONG TERM GOAL #2   Title  abitly to use right arm with minimal to no difficulty due to strength 5/5    Time  8    Period  Weeks    Status  On-going      PT LONG TERM GOAL #3   Title  ability to lift items with right arm without tightening her cervical muscles due to reduction in compensation    Period  Weeks    Status  On-going      PT LONG TERM GOAL #4   Title  full cervical ROM so patient is able to look behind her without difficulty    Time  4    Period  Weeks    Status  On-going      PT LONG TERM GOAL #5   Title  after work her neck and shoulder pain decreased >/= 75% due to improve strength and reduction in compensation    Time  8    Period  Weeks    Status  On-going            Plan - 08/12/18 0947    Clinical Impression Statement  Patient now has full thoracic rotation. Patient is having less pain in the scapular area and after work. Patient has increase thoracic and shoulder ROM. Patient has difficulty with side Plank due to decrease scapular strength. Patient needs VC to squeeze her shoulder blades with scapular exercises.     Rehab Potential  Excellent    Clinical Impairments Affecting Rehab Potential  Breast cancer; bilateral masectomy; breast implants    PT Frequency  2x / week    PT Duration  8 weeks    PT Next Visit Plan  right shoulder strength with working on muscle imbalances; wieghtbearing exercises to right upper extremity with stretch    PT Home Exercise Plan  Access Code: [email or web address]     Consulted and Agree with  Plan of Care  Patient       Patient will benefit from skilled therapeutic intervention in order to improve the following deficits and impairments:  Pain, Increased fascial restricitons, Decreased mobility, Increased muscle spasms, Decreased activity tolerance, Decreased endurance, Decreased range of motion, Decreased strength  Visit Diagnosis: Cervicalgia  Muscle weakness (generalized)  Cramp and spasm  Chronic right shoulder pain     Problem List Patient Active Problem List   Diagnosis Date Noted  . Breast cancer, right breast ([place]) 08/02/2015  . S/P mastectomy 06/29/2015  . 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 Palpitations 01/21/2014  . Family history of breast cancer 10/02/2011    Earlie Counts, PT 08/12/18 10:18 AM   [facility] [address] [place], [place], [address] Phone: [phone]   Fax:  [phone]  Name: AUDRINNA SHERMAN MRN: [id number] Date of Birth: 02/27/1966  PHYSICAL THERAPY DISCHARGE SUMMARY  Visits from Start of Care: 7  Current functional level related to goals / functional outcomes: See above. Called patient and discussed therapy on 11/05/2018. Patient would like to do her therapy at home at this time.    Remaining deficits: See above.    Education / Equipment: HEP Plan: Patient agrees  to discharge.  Patient goals were not met. Patient is being discharged due to financial reasons. Thank you for the referral. Earlie Counts, PT 11/05/18 10:17 AM   ?????

## 2018-08-20 ENCOUNTER — Encounter: Payer: Commercial Managed Care - PPO | Admitting: Physical Therapy

## 2018-08-26 ENCOUNTER — Encounter: Payer: Commercial Managed Care - PPO | Admitting: Physical Therapy

## 2018-09-02 ENCOUNTER — Encounter: Payer: Commercial Managed Care - PPO | Admitting: Physical Therapy

## 2018-09-10 ENCOUNTER — Ambulatory Visit: Payer: Self-pay | Admitting: Physical Therapy

## 2018-09-24 DIAGNOSIS — L718 Other rosacea: Secondary | ICD-10-CM | POA: Diagnosis not present

## 2018-09-24 DIAGNOSIS — L570 Actinic keratosis: Secondary | ICD-10-CM | POA: Diagnosis not present

## 2018-09-24 DIAGNOSIS — L820 Inflamed seborrheic keratosis: Secondary | ICD-10-CM | POA: Diagnosis not present

## 2018-10-08 ENCOUNTER — Telehealth: Payer: Self-pay | Admitting: Physical Therapy

## 2018-10-08 NOTE — Telephone Encounter (Signed)
Spoke to patient on the phone. She would like to wait for therapy. She is still keeping herself quarantine due to the Coronavirus.  Earlie Counts, PT @5 /10/2018@ 9:53 AM

## 2020-08-20 ENCOUNTER — Other Ambulatory Visit: Payer: Self-pay | Admitting: Gastroenterology

## 2020-08-20 DIAGNOSIS — R1084 Generalized abdominal pain: Secondary | ICD-10-CM

## 2020-09-06 ENCOUNTER — Ambulatory Visit
Admission: RE | Admit: 2020-09-06 | Discharge: 2020-09-06 | Disposition: A | Discharge status: Home / Self Care | Payer: Commercial Managed Care - PPO | Source: Ambulatory Visit | Attending: Gastroenterology | Admitting: Gastroenterology

## 2020-09-06 DIAGNOSIS — R1084 Generalized abdominal pain: Secondary | ICD-10-CM

## 2021-07-12 ENCOUNTER — Other Ambulatory Visit: Payer: Self-pay | Admitting: Gastroenterology

## 2021-07-12 DIAGNOSIS — R1012 Left upper quadrant pain: Secondary | ICD-10-CM

## 2021-07-12 DIAGNOSIS — K51519 Left sided colitis with unspecified complications: Secondary | ICD-10-CM

## 2021-07-21 ENCOUNTER — Ambulatory Visit
Admission: RE | Admit: 2021-07-21 | Discharge: 2021-07-21 | Disposition: A | Discharge status: Home / Self Care | Payer: Commercial Managed Care - PPO | Source: Ambulatory Visit | Attending: Gastroenterology | Admitting: Gastroenterology

## 2021-07-21 DIAGNOSIS — K51519 Left sided colitis with unspecified complications: Secondary | ICD-10-CM

## 2021-07-21 DIAGNOSIS — R1012 Left upper quadrant pain: Secondary | ICD-10-CM

## 2021-07-21 MED ORDER — IOPAMIDOL (ISOVUE-300) INJECTION 61%
100.0000 mL | Freq: Once | INTRAVENOUS | Status: AC | PRN
  Administered 2021-07-21: 100 mL via INTRAVENOUS

## 2021-08-05 ENCOUNTER — Telehealth: Payer: Self-pay | Admitting: Genetic Counselor

## 2021-08-05 NOTE — Telephone Encounter (Signed)
Scheduled appt per 3/3 referal. Pt is aware of appt date and time. Pt is aware to arrive 15 mins prior to appt time and to bring and updated insurance card. Pt is aware of appt location.   ?

## 2021-08-08 ENCOUNTER — Inpatient Hospital Stay: Payer: Commercial Managed Care - PPO

## 2021-08-08 ENCOUNTER — Other Ambulatory Visit: Payer: Self-pay

## 2021-08-08 ENCOUNTER — Inpatient Hospital Stay: Payer: Commercial Managed Care - PPO | Attending: Genetic Counselor | Admitting: Genetic Counselor

## 2021-08-08 ENCOUNTER — Other Ambulatory Visit: Payer: Self-pay | Admitting: Genetic Counselor

## 2021-08-08 DIAGNOSIS — Z803 Family history of malignant neoplasm of breast: Secondary | ICD-10-CM

## 2021-08-08 DIAGNOSIS — Z853 Personal history of malignant neoplasm of breast: Secondary | ICD-10-CM

## 2021-08-08 LAB — GENETIC SCREENING ORDER

## 2021-08-11 ENCOUNTER — Encounter: Payer: Self-pay | Admitting: Genetic Counselor

## 2021-08-11 DIAGNOSIS — Z853 Personal history of malignant neoplasm of breast: Secondary | ICD-10-CM

## 2021-08-11 HISTORY — DX: Personal history of malignant neoplasm of breast: Z85.3

## 2021-08-11 NOTE — Progress Notes (Signed)
REFERRING PROVIDER: Rolm Bookbinder, MD [place],  [place] [address]  PRIMARY PROVIDER:  London Pepper, MD  PRIMARY REASON FOR VISIT:  1. Personal history of breast cancer   2. Family history of breast cancer     HISTORY OF PRESENT ILLNESS:   Ms. Schulte, a 56 y.o. female, was seen for a [place] cancer genetics consultation at the request of Dr. Donne Hazel due to a personal and family history of cancer.  Ms. Hollenberg presents to clinic today to discuss the possibility of a hereditary predisposition to cancer, to discuss genetic testing, and to further clarify her future cancer risks, as well as potential cancer risks for family members.   At the age of 49, Ms. 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 She is currently undergoing evaluation for an ovarian mass.     She had negative genetics testing in 2023 through [facility].  See reports below.        CANCER HISTORY:  Oncology History  Breast cancer, right breast ([place])  10/02/2011 Procedure   BRCAnalysis through [facility] and CancerNext panel testing through [facility]: negative   05/14/2015 Mammogram   Bilateral: negative   05/24/2015 Breast MRI   Clumped linear/nodular NME within the UIQ of the right breast posterior depth spans 2.4 x 2.0 x 2.1 cm. There is also a second area of NME located laterally at 8:30 .5 x 2.0 x 1.4 cm in size   06/09/2015 Initial Biopsy   Right breast x 2 (UIQ and LOQ): DCIS, low grade, ER+ (100%), PR+ (100%)   06/09/2015 Clinical Stage   Stage 0: Tis N0   06/29/2015 Definitive Surgery   Bilateral mastectomies (left: prophlyactic, no malignancy)/SLNB: right - multifocal DCIS with necrosis. 4 LN removed and negative   06/29/2015 Pathologic Stage   Stage 0: Tis N0   08/03/2015 Survivorship   Survivorship visit completed and copy of care plan given to patient     RISK FACTORS:   Colonoscopy: yes;  three tubular adenomas in 2016 . Hysterectomy: no.  Ovaries intact: yes.  Up to date with pelvic exams: yes. Menarche was at age 17.  Nulliarpous.  OCP use for more than 10 years.  HRT use: 0 years.  Past Medical History:  Diagnosis Date   Allergic rhinitis    Anxiety    Depression    Dysrhythmia    occ PVC's, PAC's   Personal history of breast cancer 08/11/2021   PVCs (premature ventricular contractions)    Ulcerative colitis ([place])     Past Surgical History:  Procedure Laterality Date   BREAST IMPLANT EXCHANGE Bilateral 02/22/2016   Procedure: REVISION OF RECONSTRUCTIVE BILATERAL BREAST WITH SILCONE BREAST IMPLANT EXCHANGE;  Surgeon: Irene Limbo, MD;  Location: [place];  Service: Plastics;  Laterality: Bilateral;   BREAST IMPLANT EXCHANGE Bilateral 04/03/2017   Procedure: REVISION bilateral  BREAST RECONSTRUCTION WITH SILICONE IMPLANT EXCHANGE AND ALLODERM;  Surgeon: Irene Limbo, MD;  Location: [place];  Service: Plastics;  Laterality: Bilateral;   BREAST RECONSTRUCTION WITH PLACEMENT OF TISSUE EXPANDER AND FLEX HD (ACELLULAR HYDRATED DERMIS) Bilateral 06/29/2015   Procedure: BILATERAL BREAST RECONSTRUCTION WITH PLACEMENT OF TISSUE EXPANDER AND FLEX HD (ACELLULAR HYDRATED DERMIS);  Surgeon: Irene Limbo, MD;  Location: [place];  Service: Plastics;  Laterality: Bilateral;   COLONOSCOPY     2011/2012   laproscopic exam for fertility  LIPOSUCTION WITH LIPOFILLING Bilateral 09/28/2015   Procedure: LIPO FILLING FROM ABDOMEN TO BILATERAL CHEST;  Surgeon: Irene Limbo, MD;  Location: [place];  Service: Plastics;  Laterality: Bilateral;   LIPOSUCTION WITH LIPOFILLING Bilateral 02/22/2016   Procedure: LIPOFILLING TO BILATERAL CHEST;  Surgeon: Irene Limbo, MD;  Location: [place];  Service: Plastics;  Laterality: Bilateral;   NIPPLE SPARING MASTECTOMY/SENTINAL  LYMPH NODE BIOPSY/RECONSTRUCTION/PLACEMENT OF TISSUE EXPANDER Bilateral 06/29/2015   Procedure: RIGHT NIPPLE SPARING MASTECTOMY WITH SENTINAL LYMPH NODE BIOPSY AND LEFT PROPHYLACTIC NIPPLE SPARING MASTECTOMY;  Surgeon: Rolm Bookbinder, MD;  Location: [place];  Service: General;  Laterality: Bilateral;   REMOVAL OF BILATERAL TISSUE EXPANDERS WITH PLACEMENT OF BILATERAL BREAST IMPLANTS Bilateral 09/28/2015   Procedure: REMOVAL OF BILATERAL TISSUE EXPANDERS WITH PLACEMENT OF BILATERAL BREAST IMPLANTS;  Surgeon: Irene Limbo, MD;  Location: [place];  Service: Plastics;  Laterality: Bilateral;   right foot surgery       FAMILY HISTORY:  We obtained a detailed, 4-generation family history.  Significant diagnoses are listed below: Family History  Problem Relation Age of Onset   Breast cancer Mother 85   Breast cancer Sister 70       died at 55   Thyroid cancer Maternal Grandmother        dx 35-40 years   Breast cancer Maternal Grandmother 71   Lung cancer Maternal Grandmother 71   Brain cancer Maternal Grandfather 49       glioblastoma   Breast cancer Other        MGMs mother dx >50      Ms. Blanchard is unaware of previous family history of genetic testing for hereditary cancer risks. There is no reported Ashkenazi Jewish ancestry. There is no known consanguinity.  GENETIC COUNSELING ASSESSMENT: Ms. Depaoli is a 56 y.o. female with a personal and family history of cancer which is somewhat suggestive of a hereditary cancer syndrome and predisposition to cancer given the presence of related cancers in the family and ages of diagnosis. We, therefore, discussed and recommended the following at today's visit.   DISCUSSION: We discussed that 5 - 10% of cancer is hereditary, with most cases of hereditary breast cancer associated with mutations in BRCA1/2.  There are other genes that can be associated with hereditary breast and ovarian cancer syndromes.   These include but are not limited to ATM, CHEK2, PALB2, RAD51C, and RAD51D.  Type of cancer risk and level of risk are gene-specific. We discussed that testing is beneficial for several reasons including knowing how to follow individuals after completing their treatment, identifying whether potential treatment options would be beneficial, and understanding if other family members could be at risk for cancer and allowing them to undergo genetic testing.   Given that Ms. Connon had genetic testing for hereditary cancer previously, we discussed the differences between the testing that was performed in 2013 and genetic testing offered today.  We discussed that in additions to the genes she had tested previously, additional genes have been found to be associated with breast/ovarian cancer that were not included, such as RAD51D.  Furthermore, we discussed that more comprehensive testing is available with the availability of RNA testing in addition to DNA testing.  We discussed that it is reasonable for her to seek updated genetic testing.   We reviewed the characteristics, features and inheritance patterns of hereditary cancer syndromes. We also discussed genetic testing, including the appropriate family members to test, the process of testing,  insurance coverage and turn-around-time for results. We discussed the implications of a negative, positive and/or variant of uncertain significant result. In order to get genetic test results in a timely manner so that Ms. Venneman can use these genetic test results for surgical decisions related to her ovarian mass, we recommended Ms. Inabinet pursue genetic testing for the [employer] (ordered STAT).  The BRCANext gene panel offered by [employer] and includes sequencing and rearrangement analysis for the following 18 genes: ATM, BRCA1, BRCA2, BRIP1, CDH1, CHEK2, EPCAM, MLH1, MSH2, MSH6, NBN, NF1, PALB2, PMS2, PTEN, RAD51C, RAD51D, TP53. Once complete, we  recommend Ms. Kobel pursue reflex genetic testing to a more comprehensive gene panel.   Ms. Avis  was offered a common hereditary cancer panel (47 genes) and an expanded pan-cancer panel (77 genes). Ms. Knights was informed of the benefits and limitations of each panel, including that expanded pan-cancer panels contain genes that do not have clear management guidelines at this point in time.  We also discussed that as the number of genes included on a panel increases, the chances of variants of uncertain significance increases.  After considering the benefits and limitations of each gene panel, Ms. Luz  elected to have an expanded Radio broadcast assistant through [place].  The CancerNext-Expanded gene panel offered by [facility] and includes sequencing, rearrangement, and RNA analysis for the following 77 genes: AIP, ALK, APC, ATM, AXIN2, BAP1, BARD1, BLM, BMPR1A, BRCA1, BRCA2, BRIP1, CDC73, CDH1, CDK4, CDKN1B, CDKN2A, CHEK2, CTNNA1, DICER1, FANCC, FH, FLCN, GALNT12, KIF1B, LZTR1, MAX, MEN1, MET, MLH1, MSH2, MSH3, MSH6, MUTYH, NBN, NF1, NF2, NTHL1, PALB2, PHOX2B, PMS2, POT1, PRKAR1A, PTCH1, PTEN, RAD51C, RAD51D, RB1, RECQL, RET, SDHA, SDHAF2, SDHB, SDHC, SDHD, SMAD4, SMARCA4, SMARCB1, SMARCE1, STK11, SUFU, TMEM127, TP53, TSC1, TSC2, VHL and XRCC2 (sequencing and deletion/duplication); EGFR, EGLN1, HOXB13, KIT, MITF, PDGFRA, POLD1, and POLE (sequencing only); EPCAM and GREM1 (deletion/duplication only).   Based on Ms. Junco's personal and family history of cancer, she meets medical criteria for genetic testing. Despite that she meets criteria, she may still have an out of pocket cost. We discussed that if her out of pocket cost for testing is over $100, the laboratory should contact them to discuss self-pay prices, patient pay assistance programs, if applicable, and other billing options.   PLAN: After considering the risks, benefits, and limitations, Ms. Sheriff provided informed consent to  pursue genetic testing and the blood sample was sent to [facility] for analysis of the [facility] and CancerNext-Expanded +RNAinsight Panel. Results should be available within approximately 2 weeks' time, at which point they will be disclosed by telephone to Ms. Zalenski, as will any additional recommendations warranted by these results. Ms. Lorincz will receive a summary of her genetic counseling visit and a copy of her results once available. This information will also be available in Epic.   Lastly, we encouraged Ms. Dazey to remain in contact with cancer genetics annually so that we can continuously update the family history and inform her of any changes in cancer genetics and testing that may be of benefit for this family.   Ms. Vanderbeck questions were answered to her satisfaction today. Our contact information was provided should additional questions or concerns arise. Thank you for the referral and allowing [place] to share in the care of your patient.   Vasilis Luhman M. Joette Catching, [place], [facility] Genetic Counselor [email or web address] (P) [phone]  The patient was seen for a total of 30 minutes in face-to-face genetic counseling. The patient was seen alone.  Drs.  Magrinat, Lindi Adie and/or Burr Medico were available to discuss this case as needed.  _______________________________________________________________________ For Office Staff:  Number of people involved in session: 1 Was an Intern/ student involved with case: no

## 2021-08-19 ENCOUNTER — Encounter: Payer: Self-pay | Admitting: Genetic Counselor

## 2021-08-19 ENCOUNTER — Telehealth: Payer: Self-pay | Admitting: Genetic Counselor

## 2021-08-19 DIAGNOSIS — Z1379 Encounter for other screening for genetic and chromosomal anomalies: Secondary | ICD-10-CM | POA: Insufficient documentation

## 2021-08-19 NOTE — Telephone Encounter (Signed)
Contacted patient in attempt to disclose results of genetic testing.  LVM with contact information requesting a call back.  

## 2021-08-22 ENCOUNTER — Ambulatory Visit: Payer: Self-pay | Admitting: Genetic Counselor

## 2021-08-22 DIAGNOSIS — Z803 Family history of malignant neoplasm of breast: Secondary | ICD-10-CM

## 2021-08-22 DIAGNOSIS — Z1379 Encounter for other screening for genetic and chromosomal anomalies: Secondary | ICD-10-CM

## 2021-08-22 DIAGNOSIS — Z853 Personal history of malignant neoplasm of breast: Secondary | ICD-10-CM

## 2021-08-22 NOTE — Progress Notes (Signed)
HPI:   ?Christina Logan was previously seen in the [place] clinic due to a personal and family history of cancer and concerns regarding a hereditary predisposition to cancer. Please refer to our prior cancer genetics clinic note for more information regarding our discussion, assessment and recommendations, at the time. Christina Logan recent genetic test results were disclosed to her, as were recommendations warranted by these results. These results and recommendations are discussed in more detail below. ? ?CANCER HISTORY:  ?Oncology History  ?Breast cancer, right breast ([place])  ?10/02/2011 Procedure  ? [facility] through [employer] and [employer] testing through [facility]: negative ?  ?05/14/2015 Mammogram  ? Bilateral: negative ?  ?05/24/2015 Breast MRI  ? Clumped linear/nodular NME within the UIQ of the right breast posterior depth spans 2.4 x 2.0 x 2.1 cm. There is also a second area of NME located laterally at 8:30 .5 x 2.0 x 1.4 cm in size ?  ?06/09/2015 Initial Biopsy  ? 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  ?06/09/2015 Clinical Stage  ? Stage 0: Tis N0 ?  ?06/29/2015 Definitive Surgery  ? Bilateral mastectomies (left: prophlyactic, no malignancy)/SLNB: right - multifocal DCIS with necrosis. 4 LN removed and negative ?  ?06/29/2015 Pathologic Stage  ? Stage 0: Tis N0 ?  ?08/03/2015 Survivorship  ? Survivorship visit completed and copy of care plan given to patient ?  ?08/18/2021 Genetic Testing  ? Negative hereditary cancer genetic testing: no pathogenic variants detected in [facility] CancerNext-Expanded +RNAinsight Panel.  Report date is 08/18/2021.  ? ?The CancerNext-Expanded gene panel offered by [facility] and includes sequencing, rearrangement, and RNA analysis for the following 77 genes: AIP, ALK, APC, ATM, AXIN2, BAP1, BARD1, BLM, BMPR1A, BRCA1, BRCA2, BRIP1, CDC73, CDH1, CDK4, CDKN1B, CDKN2A, CHEK2, CTNNA1, DICER1, FANCC, FH, FLCN, GALNT12,  KIF1B, LZTR1, MAX, MEN1, MET, MLH1, MSH2, MSH3, MSH6, MUTYH, NBN, NF1, NF2, NTHL1, PALB2, PHOX2B, PMS2, POT1, PRKAR1A, PTCH1, PTEN, RAD51C, RAD51D, RB1, RECQL, RET, SDHA, SDHAF2, SDHB, SDHC, SDHD, SMAD4, SMARCA4, SMARCB1, SMARCE1, STK11, SUFU, TMEM127, TP53, TSC1, TSC2, VHL and XRCC2 (sequencing and deletion/duplication); EGFR, EGLN1, HOXB13, KIT, MITF, PDGFRA, POLD1, and POLE (sequencing only); EPCAM and GREM1 (deletion/duplication only).  ?  ? ? ?  ?FAMILY HISTORY:  ?We obtained a detailed, 4-generation family history.  Significant diagnoses are listed below: ?     ?Family History  ?Problem Relation Age of Onset  ? Breast cancer Mother 57  ? Breast cancer Sister 16  ?      died at 62  ? Thyroid cancer Maternal Grandmother    ?      dx 35-40 years  ? Breast cancer Maternal Grandmother 108  ? Lung cancer Maternal Grandmother 66  ? Brain cancer Maternal Grandfather 50  ?      glioblastoma  ? Breast cancer Other    ?      MGMs mother dx >50  ?  ?  ?  ?Christina Logan is unaware of previous family history of genetic testing for hereditary cancer risks. There is no reported Ashkenazi Jewish ancestry. There is no known consanguinity. ? ?GENETIC TEST RESULTS:  ?The [facility] CancerNext-Expanded +RNAinsight Panel found no pathogenic mutations. The CancerNext-Expanded gene panel offered by [facility] and includes sequencing, rearrangement, and RNA analysis for the following 77 genes: AIP, ALK, APC, ATM, AXIN2, BAP1, BARD1, BLM, BMPR1A, BRCA1, BRCA2, BRIP1, CDC73, CDH1, CDK4, CDKN1B, CDKN2A, CHEK2, CTNNA1, DICER1, FANCC, FH, FLCN, GALNT12, KIF1B, LZTR1,  MAX, MEN1, MET, MLH1, MSH2, MSH3, MSH6, MUTYH, NBN, NF1, NF2, NTHL1, PALB2, PHOX2B, PMS2, POT1, PRKAR1A, PTCH1, PTEN, RAD51C, RAD51D, RB1, RECQL, RET, SDHA, SDHAF2, SDHB, SDHC, SDHD, SMAD4, SMARCA4, SMARCB1, SMARCE1, STK11, SUFU, TMEM127, TP53, TSC1, TSC2, VHL and XRCC2 (sequencing and deletion/duplication); EGFR, EGLN1, HOXB13, KIT, MITF, PDGFRA, POLD1, and POLE (sequencing  only); EPCAM and GREM1 (deletion/duplication only).  ? ?The test report has been scanned into EPIC and is located under the Molecular Pathology section of the Results Review tab.  A portion of the result report is included below for reference. Genetic testing reported out on August 18, 2021.  ? ? ? ?Even though a pathogenic variant was not identified, possible explanations for the cancer in the family may include: ?There may be no hereditary risk for cancer in the family. The cancers in Christina Logan and/or her family may be sporadic/familial or due to other genetic and environmental factors. ?There may be a gene mutation in one of these genes that current testing methods cannot detect but that chance is small. ?There could be another gene that has not yet been discovered, or that we have not yet tested, that is responsible for the cancer diagnoses in the family.  ?It is also possible there is a hereditary cause for the cancer in the family that Christina Logan did not inherit. ? ? ?Therefore, it is important to remain in touch with cancer genetics in the future so that we can continue to offer Christina Logan the most up to date genetic testing.  ? ?ADDITIONAL GENETIC TESTING:  ?We discussed with Christina Logan that her genetic testing was fairly extensive.  If there are genes identified to increase cancer risk that can be analyzed in the future, we would be happy to discuss and coordinate this testing at that time.   ? ?CANCER SCREENING RECOMMENDATIONS:  ?Christina Logan's test result is considered negative (normal).  This means that we have not identified a hereditary cause for her personal history of breast cancer at this time.  ? ?An individual's cancer risk and medical management are not determined by genetic test results alone. Overall cancer risk assessment incorporates additional factors, including personal medical history, family history, and any available genetic information that may result in a personalized plan  for cancer prevention and surveillance. Therefore, it is recommended she continue to follow the cancer management and screening guidelines provided by her oncology and primary healthcare provider. ? ?RECOMMENDATIONS FOR FAMILY MEMBERS:   ?Individuals in this family might be at some increased risk of developing cancer, over the general population risk, due to the family history of cancer.  Individuals in the family should notify their providers of the family history of cancer. We recommend women in this family have a yearly mammogram beginning at age 24, or 38 years younger than the earliest onset of cancer, an annual clinical breast exam, and perform monthly breast self-exams.   ?Other members of the family may still carry a pathogenic variant in one of these genes that Christina Logan did not inherit. Based on the family history, we recommend her mother, who was diagnosed with breast cancer at age 88, have genetic counseling and testing.  We also recommend the biological daughters of her sister with a breast cancer history of genetic counseling/testing. Christina Logan will let us know if we can be of any assistance in coordinating genetic counseling and/or testing for these family members.   ? ? ?FOLLOW-UP:  ?Lastly, we discussed with Christina Logan that cancer genetics is  a rapidly advancing field and it is possible that new genetic tests will be appropriate for her and/or her family members in the future. We encouraged her to remain in contact with cancer genetics on an annual basis so we can update her personal and family histories and let her know of advances in cancer genetics that may benefit this family.  ? ?Our contact number was provided. Christina Logan questions were answered to her satisfaction, and she knows she is welcome to call us at anytime with additional questions or concerns.  ? ?Christina Logan M. Joette Catching, [place], [place] ?Genetic Counselor ?[email or web address] ?(P) [phone] ? ?

## 2021-08-22 NOTE — Telephone Encounter (Signed)
Revealed negative genetic testin.  Discussed that we do not know why she has a history of breast cancer or why there is cancer in the family. It could be sporadic/famillial, due to a change in a gene that she did not inherit, due to a different gene that we are not testing, or maybe our current technology may not be able to pick something up.  It will be important for her to keep in contact with genetics to keep up with whether additional testing may be needed.   ? ? ?

## 2021-08-22 NOTE — Telephone Encounter (Signed)
Contacted patient in attempt to disclose results of genetic testing.  LVM with contact information requesting a call back.  Second attempt.  

## 2022-04-06 NOTE — H&P (Signed)
Subjective:     Patient ID: Christina Logan is a 56 y.o. female.   Follow-up   6.5 years post op bilateral NSM with immediate reconstruction. 5 years post op revision reconstruction. Reports feeling left implant falling into axilla again. Also has been active with pilates, has avoided UE work as feels tight and desires prepectoral conversion.   Patient with strong family history breast cancer, presented with right breast pain and screening MMG negative. Obtained MRI with NME within the UIQ of the right breast  2.4 x 2.0 x 2.1 cm. There was also a second area of NME laterally within the right breast at the 8:30- 9 o'clock position 2.5 x 2.0 x 1.4 cm in size. There was an irregular enhancing mass located within the right breast at the 3:30 o'clock position measuring 7 x 7 x 8 mm. Biopsy of UIQ and LOQ DCIS. Final pathology with multifocal DCIS, margins clear, SLN negative.    Genetic testing 2013 negative. Repeat genetic testing 08/2021 negative   Prior 38 A/B cup, current   Right mastectomy weight 270 g Left mastectomy 241 g.    Review of Systems      Objective:   Physical Exam  Cardiovascular: Normal rate, regular rhythm and normal heart sounds.  Pulmonary/Chest: Effort normal and breath sounds normal.    Chest:  Soft no masses No adenopathy In supine position over left lateral movement appr 1 cm SN to nipple R 22 L 22 cm Nipple to IMF R 8 L 8 cm animation bilateral No rippling   Assessment:     DCIS right breast Family history breast cancer S/p bilateral NSM, dual plane TE/ADM reconstruction S/p anatomic silicone implant exchange, lipofilling S/p round cohesive silicone implant exchange, lipofilling Left implant AP malposition S/p round cohesive silicone implant exchange, ADM (Alloderm)    Plan:    Plan revision bilateral breast reconstruction with conversion to prepectoral position, silicone implant exchange and acellular dermis to bilateral chest.   Counseled with any  type implant there is a risk displacement. Reviewed risk rippling may be more in decolette area in prepectoral incision and any type of implant has risk rippling. Reviewed OP surgery, drains, use of additional ADM and post operative limitations. Remains satisfied with implant volume and would keep same style. Reviewed off label use of ADM in breast reconstruction. Reviewed implants are not permanent devices and may require additional surgery.   Additional risks including but not limited to bleeding, hematoma, seroma, asymmetry, damage to adjacent structures, blood clots in legs or lungs, infection reviewed.   Completed [facility] physician patient checklist.   Has oxycodone at home, does not think it helped. Rx for Norco, robaxin, and doxycycline provided. Desires Toradol at surgery center.       Inspira Smooth Round Extra Projection Cohesive 615 ml implants bilateral.  75 ml fat infiltrated over right chest, 80 ml of left chest.

## 2022-04-07 ENCOUNTER — Encounter (HOSPITAL_BASED_OUTPATIENT_CLINIC_OR_DEPARTMENT_OTHER): Payer: Self-pay | Admitting: Plastic Surgery

## 2022-04-10 NOTE — Progress Notes (Signed)

## 2022-04-18 ENCOUNTER — Ambulatory Visit (HOSPITAL_BASED_OUTPATIENT_CLINIC_OR_DEPARTMENT_OTHER): Payer: Commercial Managed Care - PPO | Admitting: Anesthesiology

## 2022-04-18 ENCOUNTER — Other Ambulatory Visit: Payer: Self-pay

## 2022-04-18 ENCOUNTER — Encounter (HOSPITAL_BASED_OUTPATIENT_CLINIC_OR_DEPARTMENT_OTHER)
Admission: RE | Disposition: A | Discharge status: Home / Self Care | Payer: Self-pay | Source: Home / Self Care | Attending: Plastic Surgery

## 2022-04-18 ENCOUNTER — Encounter (HOSPITAL_BASED_OUTPATIENT_CLINIC_OR_DEPARTMENT_OTHER): Payer: Self-pay | Admitting: Plastic Surgery

## 2022-04-18 ENCOUNTER — Ambulatory Visit (HOSPITAL_BASED_OUTPATIENT_CLINIC_OR_DEPARTMENT_OTHER)
Admission: RE | Admit: 2022-04-18 | Discharge: 2022-04-18 | Disposition: A | Discharge status: Home / Self Care | Payer: Commercial Managed Care - PPO | Attending: Plastic Surgery | Admitting: Plastic Surgery

## 2022-04-18 DIAGNOSIS — Z45811 Encounter for adjustment or removal of right breast implant: Secondary | ICD-10-CM

## 2022-04-18 DIAGNOSIS — Y831 Surgical operation with implant of artificial internal device as the cause of abnormal reaction of the patient, or of later complication, without mention of misadventure at the time of the procedure: Secondary | ICD-10-CM | POA: Insufficient documentation

## 2022-04-18 DIAGNOSIS — Z853 Personal history of malignant neoplasm of breast: Secondary | ICD-10-CM

## 2022-04-18 DIAGNOSIS — Z45812 Encounter for adjustment or removal of left breast implant: Secondary | ICD-10-CM | POA: Diagnosis not present

## 2022-04-18 DIAGNOSIS — T8542XA Displacement of breast prosthesis and implant, initial encounter: Secondary | ICD-10-CM | POA: Diagnosis present

## 2022-04-18 DIAGNOSIS — Z9013 Acquired absence of bilateral breasts and nipples: Secondary | ICD-10-CM | POA: Insufficient documentation

## 2022-04-18 DIAGNOSIS — Z01818 Encounter for other preprocedural examination: Secondary | ICD-10-CM

## 2022-04-18 DIAGNOSIS — Z803 Family history of malignant neoplasm of breast: Secondary | ICD-10-CM | POA: Insufficient documentation

## 2022-04-18 HISTORY — PX: APPLICATION OF A-CELL OF CHEST/ABDOMEN: SHX6302

## 2022-04-18 HISTORY — PX: BREAST IMPLANT EXCHANGE: SHX6296

## 2022-04-18 SURGERY — REPLACEMENT, IMPLANT, BREAST
Anesthesia: General | Site: Chest | Laterality: Bilateral

## 2022-04-18 MED ORDER — SODIUM CHLORIDE 0.9 % IV SOLN
INTRAVENOUS | Status: AC
  Filled 2022-04-18: qty 10

## 2022-04-18 MED ORDER — CHLORHEXIDINE GLUCONATE CLOTH 2 % EX PADS: 6.0000 | MEDICATED_PAD | Freq: Once | CUTANEOUS | Status: DC

## 2022-04-18 MED ORDER — MIDAZOLAM HCL 2 MG/2ML IJ SOLN
INTRAMUSCULAR | Status: AC
  Filled 2022-04-18: qty 2

## 2022-04-18 MED ORDER — LIDOCAINE HCL (CARDIAC) PF 100 MG/5ML IV SOSY
PREFILLED_SYRINGE | INTRAVENOUS | Status: DC | PRN
  Administered 2022-04-18: 60 mg via INTRAVENOUS

## 2022-04-18 MED ORDER — SUGAMMADEX SODIUM 200 MG/2ML IV SOLN
INTRAVENOUS | Status: DC | PRN
  Administered 2022-04-18: 200 mg via INTRAVENOUS

## 2022-04-18 MED ORDER — ONDANSETRON HCL 4 MG/2ML IJ SOLN
INTRAMUSCULAR | Status: AC
  Filled 2022-04-18: qty 2

## 2022-04-18 MED ORDER — ROCURONIUM BROMIDE 100 MG/10ML IV SOLN
INTRAVENOUS | Status: DC | PRN
  Administered 2022-04-18: 60 mg via INTRAVENOUS

## 2022-04-18 MED ORDER — OXYCODONE HCL 5 MG PO TABS
ORAL_TABLET | ORAL | Status: AC
  Filled 2022-04-18: qty 1

## 2022-04-18 MED ORDER — SUCCINYLCHOLINE CHLORIDE 200 MG/10ML IV SOSY
PREFILLED_SYRINGE | INTRAVENOUS | Status: AC
  Filled 2022-04-18: qty 10

## 2022-04-18 MED ORDER — OXYCODONE HCL 5 MG/5ML PO SOLN: 5.0000 mg | Freq: Once | ORAL | Status: AC | PRN

## 2022-04-18 MED ORDER — ACETAMINOPHEN 500 MG PO TABS
1000.0000 mg | ORAL_TABLET | ORAL | Status: AC
  Administered 2022-04-18: 1000 mg via ORAL

## 2022-04-18 MED ORDER — OXYCODONE HCL 5 MG PO TABS
5.0000 mg | ORAL_TABLET | Freq: Once | ORAL | Status: AC | PRN
  Administered 2022-04-18: 5 mg via ORAL

## 2022-04-18 MED ORDER — HYDROMORPHONE HCL 1 MG/ML IJ SOLN
INTRAMUSCULAR | Status: AC
  Filled 2022-04-18: qty 0.5

## 2022-04-18 MED ORDER — GABAPENTIN 300 MG PO CAPS
ORAL_CAPSULE | ORAL | Status: AC
  Filled 2022-04-18: qty 1

## 2022-04-18 MED ORDER — PHENYLEPHRINE HCL (PRESSORS) 10 MG/ML IV SOLN
INTRAVENOUS | Status: DC | PRN
  Administered 2022-04-18: 80 ug via INTRAVENOUS

## 2022-04-18 MED ORDER — DEXAMETHASONE SODIUM PHOSPHATE 10 MG/ML IJ SOLN
INTRAMUSCULAR | Status: AC
  Filled 2022-04-18: qty 1

## 2022-04-18 MED ORDER — PHENYLEPHRINE 80 MCG/ML (10ML) SYRINGE FOR IV PUSH (FOR BLOOD PRESSURE SUPPORT)
PREFILLED_SYRINGE | INTRAVENOUS | Status: AC
  Filled 2022-04-18: qty 10

## 2022-04-18 MED ORDER — FENTANYL CITRATE (PF) 100 MCG/2ML IJ SOLN
INTRAMUSCULAR | Status: DC | PRN
  Administered 2022-04-18: 100 ug via INTRAVENOUS

## 2022-04-18 MED ORDER — PROMETHAZINE HCL 25 MG/ML IJ SOLN: 6.2500 mg | INTRAMUSCULAR | Status: DC | PRN

## 2022-04-18 MED ORDER — PROPOFOL 10 MG/ML IV BOLUS
INTRAVENOUS | Status: DC | PRN
  Administered 2022-04-18: 150 mg via INTRAVENOUS

## 2022-04-18 MED ORDER — SODIUM CHLORIDE 0.9 % IV SOLN
INTRAVENOUS | Status: DC | PRN
  Administered 2022-04-18: 500 mL

## 2022-04-18 MED ORDER — LACTATED RINGERS IV SOLN: INTRAVENOUS | Status: DC

## 2022-04-18 MED ORDER — KETOROLAC TROMETHAMINE 30 MG/ML IJ SOLN
INTRAMUSCULAR | Status: DC | PRN
  Administered 2022-04-18: 30 mg via INTRAVENOUS

## 2022-04-18 MED ORDER — CEFAZOLIN SODIUM-DEXTROSE 2-4 GM/100ML-% IV SOLN
2.0000 g | INTRAVENOUS | Status: AC
  Administered 2022-04-18: 2 g via INTRAVENOUS

## 2022-04-18 MED ORDER — POVIDONE-IODINE 10 % EX SOLN
CUTANEOUS | Status: DC | PRN
  Administered 2022-04-18: 1 via TOPICAL

## 2022-04-18 MED ORDER — ONDANSETRON HCL 4 MG/2ML IJ SOLN
INTRAMUSCULAR | Status: DC | PRN
  Administered 2022-04-18: 4 mg via INTRAVENOUS

## 2022-04-18 MED ORDER — EPHEDRINE 5 MG/ML INJ
INTRAVENOUS | Status: AC
  Filled 2022-04-18: qty 5

## 2022-04-18 MED ORDER — BUPIVACAINE HCL (PF) 0.5 % IJ SOLN
INTRAMUSCULAR | Status: DC | PRN
  Administered 2022-04-18: 30 mL

## 2022-04-18 MED ORDER — MIDAZOLAM HCL 5 MG/5ML IJ SOLN
INTRAMUSCULAR | Status: DC | PRN
  Administered 2022-04-18: 2 mg via INTRAVENOUS

## 2022-04-18 MED ORDER — HYDROMORPHONE HCL 1 MG/ML IJ SOLN
0.2500 mg | INTRAMUSCULAR | Status: DC | PRN
  Administered 2022-04-18 (×2): 0.25 mg via INTRAVENOUS

## 2022-04-18 MED ORDER — GABAPENTIN 300 MG PO CAPS
300.0000 mg | ORAL_CAPSULE | ORAL | Status: AC
  Administered 2022-04-18: 300 mg via ORAL

## 2022-04-18 MED ORDER — LIDOCAINE 2% (20 MG/ML) 5 ML SYRINGE
INTRAMUSCULAR | Status: AC
  Filled 2022-04-18: qty 5

## 2022-04-18 MED ORDER — ACETAMINOPHEN 500 MG PO TABS
ORAL_TABLET | ORAL | Status: AC
  Filled 2022-04-18: qty 2

## 2022-04-18 MED ORDER — AMISULPRIDE (ANTIEMETIC) 5 MG/2ML IV SOLN: 10.0000 mg | Freq: Once | INTRAVENOUS | Status: DC | PRN

## 2022-04-18 MED ORDER — CEFAZOLIN SODIUM-DEXTROSE 2-4 GM/100ML-% IV SOLN
INTRAVENOUS | Status: AC
  Filled 2022-04-18: qty 100

## 2022-04-18 MED ORDER — FENTANYL CITRATE (PF) 100 MCG/2ML IJ SOLN
INTRAMUSCULAR | Status: AC
  Filled 2022-04-18: qty 2

## 2022-04-18 MED ORDER — DEXAMETHASONE SODIUM PHOSPHATE 4 MG/ML IJ SOLN
INTRAMUSCULAR | Status: DC | PRN
  Administered 2022-04-18: 5 mg via INTRAVENOUS

## 2022-04-18 MED ORDER — KETOROLAC TROMETHAMINE 30 MG/ML IJ SOLN
INTRAMUSCULAR | Status: AC
  Filled 2022-04-18: qty 1

## 2022-04-18 SURGICAL SUPPLY — 67 items
ADH SKN CLS APL DERMABOND .7 (GAUZE/BANDAGES/DRESSINGS) ×1
ALLOGRAFT PERF 16X20 1.6+/-0.4 (Tissue) IMPLANT
APL PRP STRL LF DISP 70% ISPRP (MISCELLANEOUS) ×2
BAG DECANTER FOR FLEXI CONT (MISCELLANEOUS) ×2 IMPLANT
BINDER BREAST LRG (GAUZE/BANDAGES/DRESSINGS) IMPLANT
BINDER BREAST MEDIUM (GAUZE/BANDAGES/DRESSINGS) IMPLANT
BINDER BREAST XLRG (GAUZE/BANDAGES/DRESSINGS) IMPLANT
BINDER BREAST XXLRG (GAUZE/BANDAGES/DRESSINGS) IMPLANT
BLADE SURG 10 STRL SS (BLADE) ×2 IMPLANT
BLADE SURG 15 STRL LF DISP TIS (BLADE) ×2 IMPLANT
BLADE SURG 15 STRL SS (BLADE) ×1
BNDG GAUZE DERMACEA FLUFF 4 (GAUZE/BANDAGES/DRESSINGS) IMPLANT
BNDG GZE DERMACEA 4 6PLY (GAUZE/BANDAGES/DRESSINGS)
CANISTER SUCT 1200ML W/VALVE (MISCELLANEOUS) ×2 IMPLANT
CHLORAPREP W/TINT 26 (MISCELLANEOUS) ×2 IMPLANT
COVER BACK TABLE 60X90IN (DRAPES) ×2 IMPLANT
COVER MAYO STAND STRL (DRAPES) ×2 IMPLANT
DERMABOND ADVANCED .7 DNX12 (GAUZE/BANDAGES/DRESSINGS) ×2 IMPLANT
DRAIN CHANNEL 15F RND FF W/TCR (WOUND CARE) IMPLANT
DRAIN CHANNEL 19F RND (DRAIN) IMPLANT
DRAPE INCISE IOBAN 66X45 STRL (DRAPES) IMPLANT
DRAPE TOP ARMCOVERS (MISCELLANEOUS) ×2 IMPLANT
DRAPE U-SHAPE 76X120 STRL (DRAPES) ×2 IMPLANT
DRAPE UTILITY XL STRL (DRAPES) ×2 IMPLANT
DRSG TEGADERM 2-3/8X2-3/4 SM (GAUZE/BANDAGES/DRESSINGS) IMPLANT
DRSG TEGADERM 4X10 (GAUZE/BANDAGES/DRESSINGS) IMPLANT
ELECT BLADE 4.0 EZ CLEAN MEGAD (MISCELLANEOUS)
ELECT COATED BLADE 2.86 ST (ELECTRODE) ×2 IMPLANT
ELECTRODE BLDE 4.0 EZ CLN MEGD (MISCELLANEOUS) ×2 IMPLANT
EVACUATOR SILICONE 100CC (DRAIN) IMPLANT
GAUZE PAD ABD 8X10 STRL (GAUZE/BANDAGES/DRESSINGS) ×4 IMPLANT
GLOVE BIO SURGEON STRL SZ 6 (GLOVE) ×2 IMPLANT
GOWN STRL REUS W/ TWL LRG LVL3 (GOWN DISPOSABLE) ×4 IMPLANT
GOWN STRL REUS W/TWL LRG LVL3 (GOWN DISPOSABLE) ×2
IMPL BREAST 6.4X FULL 615 (Breast) IMPLANT
IMPL BRST 6.4X FULL 615CC (Breast) ×2 IMPLANT
IMPLANT BREAST GEL 615CC (Breast) ×2 IMPLANT
MARKER SKIN DUAL TIP RULER LAB (MISCELLANEOUS) IMPLANT
NDL HYPO 25X1 1.5 SAFETY (NEEDLE) IMPLANT
NDL SAFETY ECLIP 18X1.5 (MISCELLANEOUS) ×2 IMPLANT
NEEDLE HYPO 25X1 1.5 SAFETY (NEEDLE) ×1 IMPLANT
PENCIL SMOKE EVACUATOR (MISCELLANEOUS) ×2 IMPLANT
PIN SAFETY STERILE (MISCELLANEOUS) IMPLANT
PUNCH BIOPSY 4MM DISP (MISCELLANEOUS) IMPLANT
SHEET MEDIUM DRAPE 40X70 STRL (DRAPES) ×2 IMPLANT
SPONGE T-LAP 18X18 ~~LOC~~+RFID (SPONGE) ×4 IMPLANT
STAPLER VISISTAT 35W (STAPLE) ×2 IMPLANT
SUT CHROMIC 4 0 PS 2 18 (SUTURE) IMPLANT
SUT ETHILON 2 0 FS 18 (SUTURE) ×2 IMPLANT
SUT MNCRL AB 4-0 PS2 18 (SUTURE) ×2 IMPLANT
SUT PDS AB 2-0 CT2 27 (SUTURE) IMPLANT
SUT SILK 2 0 SH (SUTURE) IMPLANT
SUT VIC AB 3-0 SH 27 (SUTURE) ×1
SUT VIC AB 3-0 SH 27X BRD (SUTURE) IMPLANT
SUT VICRYL 0 CT-2 (SUTURE) IMPLANT
SUT VICRYL 4-0 PS2 18IN ABS (SUTURE) IMPLANT
SUT VLOC 180 0 24IN GS25 (SUTURE) IMPLANT
SYR 10ML LL (SYRINGE) ×2 IMPLANT
SYR 50ML LL SCALE MARK (SYRINGE) ×2 IMPLANT
SYR BULB IRRIG 60ML STRL (SYRINGE) ×2 IMPLANT
SYR CONTROL 10ML LL (SYRINGE) IMPLANT
TAPE MEASURE VINYL STERILE (MISCELLANEOUS) IMPLANT
TOWEL GREEN STERILE FF (TOWEL DISPOSABLE) ×2 IMPLANT
TRAY DSU PREP LF (CUSTOM PROCEDURE TRAY) IMPLANT
TUBE CONNECTING 20X1/4 (TUBING) IMPLANT
UNDERPAD 30X36 HEAVY ABSORB (UNDERPADS AND DIAPERS) ×4 IMPLANT
YANKAUER SUCT BULB TIP NO VENT (SUCTIONS) ×2 IMPLANT

## 2022-04-18 NOTE — Anesthesia Postprocedure Evaluation (Signed)
Anesthesia Post Note  Patient: Christina Logan  Procedure(s) Performed: REVISION BREAST RECONSTRUCTION WITH SILICONE IMPLANT EXCHANGE (Bilateral: Chest) ACELLULAR DERMIS (Bilateral: Chest)     Patient location during evaluation: PACU Anesthesia Type: General Level of consciousness: awake and alert Pain management: pain level controlled Vital Signs Assessment: post-procedure vital signs reviewed and stable Respiratory status: spontaneous breathing, nonlabored ventilation and respiratory function stable Cardiovascular status: blood pressure returned to baseline and stable Postop Assessment: no apparent nausea or vomiting Anesthetic complications: no   No notable events documented.  Last Vitals:  Vitals:   04/18/22 1430 04/18/22 1452  BP: 120/86 115/86  Pulse: 81 78  Resp: 19 18  Temp:  (!) 36.3 C  SpO2: 95% 96%    Last Pain:  Vitals:   04/18/22 1504  TempSrc:   PainSc: [place]

## 2022-04-18 NOTE — Transfer of Care (Signed)
Immediate Anesthesia Transfer of Care Note  Patient: Christina Logan  Procedure(s) Performed: REVISION BREAST RECONSTRUCTION WITH SILICONE IMPLANT EXCHANGE (Bilateral: Chest) ACELLULAR DERMIS (Bilateral: Chest)  Patient Location: PACU  Anesthesia Type:General  Level of Consciousness: awake, alert , oriented, drowsy, and patient cooperative  Airway & Oxygen Therapy: Patient Spontanous Breathing and Patient connected to face mask oxygen  Post-op Assessment: Report given to RN and Post -op Vital signs reviewed and stable  Post vital signs: Reviewed and stable  Last Vitals:  Vitals Value Taken Time  BP 114/83 04/18/22 1400  Temp 36.6 C 04/18/22 1358  Pulse 91 04/18/22 1402  Resp 20 04/18/22 1402  SpO2 95 % 04/18/22 1402  Vitals shown include unvalidated device data.  Last Pain:  Vitals:   04/18/22 0949  TempSrc: Oral  PainSc: 0-No pain         Complications: No notable events documented.

## 2022-04-18 NOTE — Op Note (Signed)
Operative Note   DATE OF OPERATION: 11.14.23  LOCATION: [facility]-outpatient  SURGICAL DIVISION: Plastic Surgery  PREOPERATIVE DIAGNOSES:  1. History breast cancer 2. Acquired absence breasts  POSTOPERATIVE DIAGNOSES:  same  PROCEDURE:  1. Revision bilateral breast reconstruction with silicone implant exchange. 2. Acellular dermis (Alloderm) to bilateral chest  SURGEON: Irene Limbo MD MBA  ASSISTANT: none  ANESTHESIA:  General.   EBL: 45 ml  COMPLICATIONS: None immediate.   INDICATIONS FOR PROCEDURE:  The patient, Christina Logan, is a 56 y.o. female born on April 19, 1966, is here for revision from dual plane to prepectoral implant based breast reconstruction following nipple sparing mastectomies.   FINDINGS: Removed smooth round intact silicone implants. [place] smooth round Cohesive gel Extra projection 615 ml implants placed bilateral. REF [id number] RIGHT SN [id number] LEFT SN [id number]  DESCRIPTION OF PROCEDURE:  The patient's operative site was marked with the patient in the preoperative area. The patient was taken to the operating room. SCDs were placed and IV antibiotics were given. The patient's operative site was prepped and draped in a sterile fashion. A time out was performed and all information was confirmed to be correct. Incision made in left inframammary fold scar and carried through superficial fascia and acellular dermis. Intact implant removed. Caudal border pectoralis muscle identified and muscle dissected free from mastectomy flap. Muscle inset to chest wall with 0 V lock suture. Local anesthetic infiltrated. Cavity irrigated with saline solution containing Ancef, gentamicin, and Betadine. Hemostasis ensured. 15 Fr JP placed along inframammary fold. 27 Fr JP place along lateral and superior border cavity. Each secured to skin with 2-0 nylon suture.   I then directed my attention to right chest. Incision made in inframammary fold scar and carried through  superficial fascia and acellular dermis. Intact implant removed. Caudal border pectoralis muscle identified and muscle dissected free from mastectomy flap. Muscle inset to chest wall with 0 V lock suture. Local anesthetic infiltrated. Cavity irrigated with saline solution containing Ancef, gentamicin, and Betadine. Hemostasis ensured. 15 Fr JP placed along inframammary fold. 28 Fr JP place along lateral and superior border cavity. Each secured to skin with 2-0 nylon suture.  Acellular dermis prepared. This was wrapped around implant on back table with 2-0 PDS spanning sutures. Implant acellular dermis construct placed in right chest cavity. Acellular dermis secured to chest wall along inframammary fold with 0 V lock suture. Closure completed with 3-0 vicryl in superficial fascia, 4-0 vicryl in dermis, and 4-0 monocryl subcuticular skin closure. Over left chest, acellular dermis prepared and wrapped around implant on back table with 2-0 PDS spanning sutures. Implant acellular dermis construct placed in left chest cavity. Acellular dermis secured to chest wall along inframammary fold with 0 V lock suture. Closure completed with 3-0 vicryl in superficial fascia, 4-0 vicryl in dermis, and 4-0 monocryl subcuticular skin closure. Dermabond applied to incisions. Tegaderms applied over right and left chest.  The patient was allowed to wake from anesthesia, extubated and taken to the recovery room in satisfactory condition.   SPECIMENS: none  DRAINS: 15 and 19 Fr JP in right and left subcutaneous position  Irene Limbo, MD [facility] Plastic & Reconstructive Surgery  Office/ physician access line after hours [phone]

## 2022-04-18 NOTE — Interval H&P Note (Signed)
History and Physical Interval Note:  04/18/2022 10:02 AM  Lesli Albee  has presented today for surgery, with the diagnosis of history DCIS, family history breast cancer, acquired absence breasts.  The various methods of treatment have been discussed with the patient and family. After consideration of risks, benefits and other options for treatment, the patient has consented to  Procedure(s): REVISION BREAST RECONSTRUCTION WITH SILICONE IMPLANT EXCHANGE (Bilateral) ACELLULAR DERMIS (Bilateral) as a surgical intervention.  The patient's history has been reviewed, patient examined, no change in status, stable for surgery.  I have reviewed the patient's chart and labs.  Questions were answered to the patient's satisfaction.     Arnoldo Hooker Rachelann Enloe

## 2022-04-18 NOTE — Anesthesia Procedure Notes (Signed)
Procedure Name: Intubation Date/Time: 04/18/2022 10:54 AM  Performed by: Willa Frater, CRNAPre-anesthesia Checklist: Patient identified, Emergency Drugs available, Suction available and Patient being monitored Patient Re-evaluated:Patient Re-evaluated prior to induction Oxygen Delivery Method: Circle system utilized Preoxygenation: Pre-oxygenation with 100% oxygen Induction Type: IV induction Ventilation: Mask ventilation without difficulty Laryngoscope Size: Mac and 3 Grade View: Grade I Tube type: Oral Tube size: 7.0 mm Number of attempts: 1 Airway Equipment and Method: Stylet and Oral airway Placement Confirmation: ETT inserted through vocal cords under direct vision, positive ETCO2 and breath sounds checked- equal and bilateral Secured at: 22 cm Tube secured with: Tape Dental Injury: Teeth and Oropharynx as per pre-operative assessment

## 2022-04-18 NOTE — Anesthesia Preprocedure Evaluation (Signed)
Anesthesia Evaluation  Patient identified by MRN, date of birth, ID band Patient awake    Reviewed: Allergy & Precautions, NPO status , Patient's Chart, lab work & pertinent test results  Airway Mallampati: I  TM Distance: >3 FB Neck ROM: Full    Dental  (+) Teeth Intact, Dental Advisory Given   Pulmonary neg pulmonary ROS   breath sounds clear to auscultation       Cardiovascular Exercise Tolerance: Good  Rhythm:Regular Rate:Normal  ECG: SR, rate 60  PAC's and PVC's    Neuro/Psych  PSYCHIATRIC DISORDERS Anxiety Depression    negative neurological ROS     GI/Hepatic Neg liver ROS, PUD,,,Ulcerative colitis    Endo/Other  negative endocrine ROS    Renal/GU negative Renal ROS     Musculoskeletal negative musculoskeletal ROS (+)    Abdominal Normal abdominal exam  (+)   Peds  Hematology negative hematology ROS (+)   Anesthesia Other Findings   Reproductive/Obstetrics                             Anesthesia Physical Anesthesia Plan  ASA: II  Anesthesia Plan: General   Post-op Pain Management: Tylenol PO (pre-op)* and Gabapentin PO (pre-op)*   Induction: Intravenous  PONV Risk Score and Plan: 3 and Ondansetron, Dexamethasone, Midazolam and Treatment may vary due to age or medical condition  Airway Management Planned: LMA and Oral ETT  Additional Equipment:   Intra-op Plan:   Post-operative Plan: Extubation in OR  Informed Consent: I have reviewed the patients History and Physical, chart, labs and discussed the procedure including the risks, benefits and alternatives for the proposed anesthesia with the patient or authorized representative who has indicated his/her understanding and acceptance.     Dental advisory given  Plan Discussed with: CRNA  Anesthesia Plan Comments:         Anesthesia Quick Evaluation

## 2022-04-18 NOTE — Discharge Instructions (Signed)

## 2022-04-19 ENCOUNTER — Encounter (HOSPITAL_BASED_OUTPATIENT_CLINIC_OR_DEPARTMENT_OTHER): Payer: Self-pay | Admitting: Plastic Surgery

## 2022-04-21 ENCOUNTER — Encounter (HOSPITAL_BASED_OUTPATIENT_CLINIC_OR_DEPARTMENT_OTHER): Payer: Self-pay | Admitting: Plastic Surgery

## 2022-05-01 ENCOUNTER — Other Ambulatory Visit (HOSPITAL_COMMUNITY): Payer: Self-pay | Admitting: Family Medicine

## 2022-05-01 ENCOUNTER — Ambulatory Visit (HOSPITAL_COMMUNITY)
Admission: RE | Admit: 2022-05-01 | Discharge: 2022-05-01 | Disposition: A | Discharge status: Home / Self Care | Payer: Commercial Managed Care - PPO | Source: Ambulatory Visit | Attending: Surgery | Admitting: Surgery

## 2022-05-01 DIAGNOSIS — R2241 Localized swelling, mass and lump, right lower limb: Secondary | ICD-10-CM | POA: Insufficient documentation

## 2022-07-20 ENCOUNTER — Telehealth: Payer: Self-pay | Admitting: Hematology

## 2022-07-20 NOTE — Telephone Encounter (Signed)
Per 2/15 IB reached out to patient to schedule , unable to reach patient

## 2022-08-11 ENCOUNTER — Telehealth: Payer: Self-pay | Admitting: Hematology

## 2022-08-11 NOTE — Telephone Encounter (Signed)
scheduled per 3/7 referral, pt has been called and confirmed date and time. Pt is aware of location and to arrive early for check in   

## 2022-09-06 ENCOUNTER — Encounter: Payer: Self-pay | Admitting: Hematology

## 2022-09-06 ENCOUNTER — Inpatient Hospital Stay: Payer: Commercial Managed Care - PPO | Attending: Hematology | Admitting: Hematology

## 2022-09-06 ENCOUNTER — Other Ambulatory Visit: Payer: Self-pay

## 2022-09-06 ENCOUNTER — Inpatient Hospital Stay: Payer: Commercial Managed Care - PPO

## 2022-09-06 VITALS — BP 109/82 | HR 95 | Temp 98.9°F | Resp 15 | Wt 168.5 lb

## 2022-09-06 DIAGNOSIS — Z90721 Acquired absence of ovaries, unilateral: Secondary | ICD-10-CM | POA: Diagnosis not present

## 2022-09-06 DIAGNOSIS — R232 Flushing: Secondary | ICD-10-CM | POA: Diagnosis not present

## 2022-09-06 DIAGNOSIS — D0511 Intraductal carcinoma in situ of right breast: Secondary | ICD-10-CM

## 2022-09-06 DIAGNOSIS — C50911 Malignant neoplasm of unspecified site of right female breast: Secondary | ICD-10-CM | POA: Insufficient documentation

## 2022-09-06 DIAGNOSIS — Z803 Family history of malignant neoplasm of breast: Secondary | ICD-10-CM | POA: Insufficient documentation

## 2022-09-06 DIAGNOSIS — Z9013 Acquired absence of bilateral breasts and nipples: Secondary | ICD-10-CM | POA: Diagnosis not present

## 2022-09-06 DIAGNOSIS — Z79899 Other long term (current) drug therapy: Secondary | ICD-10-CM | POA: Insufficient documentation

## 2022-09-06 DIAGNOSIS — Z170 Estrogen receptor positive status [ER+]: Secondary | ICD-10-CM | POA: Insufficient documentation

## 2022-09-06 DIAGNOSIS — M2550 Pain in unspecified joint: Secondary | ICD-10-CM | POA: Insufficient documentation

## 2022-09-06 NOTE — Progress Notes (Signed)
[place]   [phone] [phone] [facility] Note   Patient Care Team: London Pepper, MD as PCP - General (Family Medicine) Rolm Bookbinder, MD as Consulting Physician (General Surgery) Truitt Merle, MD as Consulting Physician (Hematology) Sylvan Cheese, NP as Nurse Practitioner (Hematology and Oncology) Irene Limbo, MD as Consulting Physician (Plastic Surgery)  Date of Service:  09/06/2022   CHIEF COMPLAINTS/PURPOSE OF CONSULTATION:  History of  Breast Cancer  REFERRING PHYSICIAN:  Melina Copa, MD   ASSESSMENT & PLAN:  Christina Logan is a 57 y.o. hysterectomy female with a history of   Right breast DCIS, ER+ -Diagnosed in January 2017, she is status post bilateral mastectomy, with negative margins.  She had multiple reconstruction surgeries. -No adjuvant therapy needed.  We discussed very small risk (<5%) of breast cancer after double mastectomy. -I encouraged her to do self exam, no need routine mammogram after mastectomy.  2.  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  She would like to take hormonal replacement for her menopause symptoms.  I explained to her it is safe to do so, because she has had bilateral mastectomy.  She only had DCIS in the past, no invasive cancer, she is not at risk for distant recurrence.      PLAN:  - I explain to the pt that she can go on estrogen replacement since she has had a double mastectomy. -She will follow-up with her gynecologist for hormonal replacement. -I will see her as needed   Oncology History Overview Note   Cancer Staging  Breast cancer, right breast Staging form: Breast, AJCC 7th Edition - Clinical stage from 06/09/2015: Stage 0 (Tis (DCIS), N0, M0) -  Unsigned Laterality: Right - Pathologic stage from 06/29/2015: Stage 0 (Tis (DCIS), N0, cM0) - Unsigned Laterality: Right     Ductal carcinoma in situ (DCIS) of right breast  06/12/2015 Initial Diagnosis   Ductal carcinoma in situ (DCIS) of right breast   Breast cancer, right breast  10/02/2011 Procedure   BRCAnalysis through [facility] and CancerNext panel testing through [facility]: negative   05/14/2015 Mammogram   Bilateral: negative   05/24/2015 Breast MRI   Clumped linear/nodular NME within the UIQ of the right breast posterior depth spans 2.4 x 2.0 x 2.1 cm. There is also a second area of NME located laterally at 8:30 .5 x 2.0 x 1.4 cm in size   06/09/2015 Initial Biopsy   Right breast x 2 (UIQ and LOQ): DCIS, low grade, ER+ (100%), PR+ (100%)   06/09/2015 Clinical Stage   Stage 0: Tis N0   06/29/2015 Definitive Surgery   Bilateral mastectomies (left: prophlyactic, no malignancy)/SLNB: right - multifocal DCIS with necrosis. 4 LN removed and negative   06/29/2015 Pathologic Stage   Stage 0: Tis N0   08/03/2015 Survivorship   Survivorship visit completed and copy of care plan given to patient   08/18/2021 Genetic Testing   Negative hereditary cancer genetic testing: no pathogenic variants detected in [facility] CancerNext-Expanded +RNAinsight Panel.  Report date is 08/18/2021.   The CancerNext-Expanded gene panel offered by [facility] and includes sequencing, rearrangement, and RNA analysis for the following 77 genes: AIP, ALK, APC, ATM, AXIN2, BAP1, BARD1, BLM, BMPR1A, BRCA1, BRCA2, BRIP1, CDC73, CDH1, CDK4, CDKN1B, CDKN2A, CHEK2, CTNNA1, DICER1, FANCC, FH,  FLCN, GALNT12, KIF1B, LZTR1, MAX, MEN1, MET, MLH1, MSH2, MSH3, MSH6, MUTYH, NBN, NF1, NF2, NTHL1, PALB2, PHOX2B, PMS2, POT1, PRKAR1A, PTCH1, PTEN, RAD51C, RAD51D, RB1, RECQL, RET, SDHA, SDHAF2, SDHB, SDHC, SDHD, SMAD4, SMARCA4, SMARCB1, SMARCE1, STK11, SUFU, TMEM127, TP53, TSC1, TSC2, VHL and XRCC2 (sequencing and  deletion/duplication); EGFR, EGLN1, HOXB13, KIT, MITF, PDGFRA, POLD1, and POLE (sequencing only); EPCAM and GREM1 (deletion/duplication only).       HISTORY OF PRESENTING ILLNESS:  Christina Logan 57 y.o. female is a here because of history of breast cancer. The patient was referred by her GYN Adkin,Gretchin,MD. The patient presents to the clinic today alone.   She was seen by me in June 25, 2015, when she was diagnosed with right breast DCIS.  She underwent bilateral mastectomy in the right sentinel lymph node biopsy in later January 2017 with negative margins.  No invasive cancer on her surgical path.  She underwent bilateral implant reconstruction later on.  She was found to have a left ovarian mass in March 2023, status post BSO August 31, 2021.  Surgical path showed benign left ovarian mass, no malignancy.  Her last menstrual period was 1 year before her surgery, her menopause symptoms got worse after her BSO, especially hot flashes, vaginal dryness and pain with intercourse, and multiple joint pain it etc.  She would like to have estrogen replacement, and was referred by her gynecologist to discuss the safety of estrogen replacement.   Today the patient notes they felt/feeling prior/after.Marland KitchenMarland KitchenPt state that she has some joint pain.   She has a PMHx of.... Anxiety Depression Mom- Breast Cancer Grandmother- Breast Cancer Sister- Breast Cancer   Socially... Married 1 child   GYN HISTORY  Menarchal: xx LMP: xx Contraceptive:Total Hysterectomy HRT: no GP:G1,P1    REVIEW OF SYSTEMS:    Constitutional: Denies fevers, chills or abnormal night sweats Eyes: Denies blurriness of vision, double vision or watery eyes Ears, nose, mouth, throat, and face: Denies mucositis or sore throat Respiratory: Denies cough, dyspnea or wheezes Cardiovascular: Denies palpitation, chest discomfort or lower extremity swelling Gastrointestinal:  Denies nausea, heartburn or change in bowel  habits Skin: Denies abnormal skin rashes Lymphatics: Denies new lymphadenopathy or easy bruising Neurological:Denies numbness, tingling or new weaknesses Behavioral/Psych: Mood is stable, no new changes  All other systems were reviewed with the patient and are negative.   MEDICAL HISTORY:  Past Medical History:  Diagnosis Date   Allergic rhinitis    Anxiety    Depression    Dysrhythmia    occ PVC's, PAC's   Personal history of breast cancer 08/11/2021   PVCs (premature ventricular contractions)    Ulcerative colitis     SURGICAL HISTORY: Past Surgical History:  Procedure Laterality Date   APPLICATION OF A-CELL OF CHEST/ABDOMEN Bilateral 04/18/2022   Procedure: ACELLULAR DERMIS;  Surgeon: Irene Limbo, MD;  Location: [place];  Service: Plastics;  Laterality: Bilateral;   BREAST IMPLANT EXCHANGE Bilateral 02/22/2016   Procedure: REVISION OF RECONSTRUCTIVE BILATERAL BREAST WITH SILCONE BREAST IMPLANT EXCHANGE;  Surgeon: Irene Limbo, MD;  Location: [place];  Service: Plastics;  Laterality: Bilateral;   BREAST IMPLANT EXCHANGE Bilateral 04/03/2017   Procedure: REVISION bilateral  BREAST RECONSTRUCTION WITH SILICONE IMPLANT EXCHANGE AND ALLODERM;  Surgeon: Irene Limbo, MD;  Location: [facility];  Service: Plastics;  Laterality: Bilateral;   BREAST IMPLANT EXCHANGE Bilateral 04/18/2022   Procedure: REVISION BREAST RECONSTRUCTION WITH SILICONE IMPLANT EXCHANGE;  Surgeon: Irene Limbo, MD;  Location: [facility];  Service: Clinical cytogeneticist;  Laterality: Bilateral;   BREAST RECONSTRUCTION WITH PLACEMENT OF TISSUE EXPANDER AND FLEX HD (ACELLULAR HYDRATED DERMIS) Bilateral 06/29/2015   Procedure: BILATERAL BREAST RECONSTRUCTION WITH PLACEMENT OF TISSUE EXPANDER AND FLEX HD (ACELLULAR HYDRATED DERMIS);  Surgeon: Irene Limbo, MD;  Location: [place];  Service: Plastics;  Laterality: Bilateral;    COLONOSCOPY     2011/2012   laproscopic exam for fertility     LIPOSUCTION WITH LIPOFILLING Bilateral 09/28/2015   Procedure: LIPO FILLING FROM ABDOMEN TO BILATERAL CHEST;  Surgeon: Irene Limbo, MD;  Location: [place];  Service: Plastics;  Laterality: Bilateral;   LIPOSUCTION WITH LIPOFILLING Bilateral 02/22/2016   Procedure: LIPOFILLING TO BILATERAL CHEST;  Surgeon: Irene Limbo, MD;  Location: [facility];  Service: Plastics;  Laterality: Bilateral;   NIPPLE SPARING MASTECTOMY/SENTINAL LYMPH NODE BIOPSY/RECONSTRUCTION/PLACEMENT OF TISSUE EXPANDER Bilateral 06/29/2015   Procedure: RIGHT NIPPLE SPARING MASTECTOMY WITH SENTINAL LYMPH NODE BIOPSY AND LEFT PROPHYLACTIC NIPPLE SPARING MASTECTOMY;  Surgeon: Rolm Bookbinder, MD;  Location: [facility];  Service: General;  Laterality: Bilateral;   REMOVAL OF BILATERAL TISSUE EXPANDERS WITH PLACEMENT OF BILATERAL BREAST IMPLANTS Bilateral 09/28/2015   Procedure: REMOVAL OF BILATERAL TISSUE EXPANDERS WITH PLACEMENT OF BILATERAL BREAST IMPLANTS;  Surgeon: Irene Limbo, MD;  Location: [facility];  Service: Plastics;  Laterality: Bilateral;   right foot surgery      SOCIAL HISTORY: Social History   Socioeconomic History   Marital status: Married    Spouse name: Not on file   Number of children: Not on file   Years of education: Not on file   Highest education level: Not on file  Occupational History   Not on file  Tobacco Use   Smoking status: Never   Smokeless tobacco: Never  Substance and Sexual Activity   Alcohol use: No   Drug use: No   Sexual activity: Yes    Birth control/protection: None    Comment: Husband had vasectomy  Other Topics Concern   Not on file  Social History Narrative   Not on file   Social Determinants of Health   Financial Resource Strain: Not on file  Food Insecurity: Not on file  Transportation Needs: Not on file  Physical Activity: Not on  file  Stress: Not on file  Social Connections: Not on file  Intimate Partner Violence: Not on file    FAMILY HISTORY: Family History  Problem Relation Age of Onset   Breast cancer Mother 58   Diverticulosis Father    Breast cancer Sister 32       died at 25   Thyroid cancer Maternal Grandmother        dx 35-40 years   Breast cancer Maternal Grandmother 71   Lung cancer Maternal Grandmother 34   Brain cancer Maternal Grandfather 5       glioblastoma   Breast cancer Other        MGMs mother dx >50    ALLERGIES:  is allergic to adhesive [tape] and sulfa antibiotics.  MEDICATIONS:  Current Outpatient Medications  Medication Sig Dispense Refill   Ascorbic Acid (VITAMIN C PO) Take by mouth.     BuPROPion HCl (WELLBUTRIN XL PO) Take 200 mg by mouth 2 (two) times daily.     Cholecalciferol (VITAMIN D) 2000 units CAPS Take 1 capsule by mouth daily.     clonazePAM (KLONOPIN) 0.5 MG tablet Take 0.5 mg by mouth as needed.  0   Fexofenadine HCl (ALLEGRA  PO) Take by mouth.     No current facility-administered medications for this visit.    PHYSICAL EXAMINATION: ECOG PERFORMANCE STATUS: 0 - Asymptomatic  Vitals:   09/06/22 1518  BP: 109/82  Pulse: 95  Resp: 15  Temp: 98.9 F (37.2 C)  SpO2: 98%   Filed Weights   09/06/22 1518  Weight: 168 lb 8 oz (76.4 kg)     GENERAL:alert, no distress and comfortable SKIN: skin color normal, no rashes or significant lesions EYES: normal, Conjunctiva are pink and non-injected, sclera clear  NEURO: alert & oriented x 3 with fluent speech  LABORATORY DATA:  I have reviewed the data as listed    Latest Ref Rng & Units 06/25/2015    9:45 AM  CBC  WBC 4.0 - 10.5 K/uL 5.2   Hemoglobin 12.0 - 15.0 g/dL 13.6   Hematocrit 36.0 - 46.0 % 40.1   Platelets 150 - 400 K/uL 424        Latest Ref Rng & Units 06/25/2015    9:45 AM  CMP  Glucose 65 - 99 mg/dL 96   BUN 6 - 20 mg/dL 7   Creatinine 0.44 - 1.00 mg/dL 0.92   Sodium 135 - 145  mmol/L 142   Potassium 3.5 - 5.1 mmol/L 4.7   Chloride 101 - 111 mmol/L 106   CO2 22 - 32 mmol/L 26   Calcium 8.9 - 10.3 mg/dL 9.2      RADIOGRAPHIC STUDIES: I have personally reviewed the radiological images as listed and agreed with the findings in the report. No results found.   No orders of the defined types were placed in this encounter.   All questions were answered. The patient knows to call the clinic with any problems, questions or concerns. The total time spent in the appointment was 20 minutes.     Truitt Merle, MD 09/06/2022 4:38 PM  I, Audry Riles, am acting as scribe for Truitt Merle, MD.   I have reviewed the above documentation for accuracy and completeness, and I agree with the above.

## 2022-09-07 ENCOUNTER — Telehealth: Payer: Self-pay

## 2022-09-07 NOTE — Telephone Encounter (Addendum)
Faxed referral notes to Dr. Aggie Hacker   ----- Message from Truitt Merle, MD sent at 09/06/2022  4:46 PM EDT ----- Please send my note to her referral MD, thx

## 2023-01-01 ENCOUNTER — Other Ambulatory Visit: Payer: Self-pay | Admitting: Family Medicine

## 2023-01-01 DIAGNOSIS — R221 Localized swelling, mass and lump, neck: Secondary | ICD-10-CM

## 2023-01-05 ENCOUNTER — Ambulatory Visit
Admission: RE | Admit: 2023-01-05 | Discharge: 2023-01-05 | Disposition: A | Discharge status: Home / Self Care | Payer: Commercial Managed Care - PPO | Source: Ambulatory Visit | Attending: Family Medicine | Admitting: Family Medicine

## 2023-01-05 DIAGNOSIS — R221 Localized swelling, mass and lump, neck: Secondary | ICD-10-CM

## 2023-01-10 ENCOUNTER — Other Ambulatory Visit: Payer: Self-pay | Admitting: Family Medicine

## 2023-01-10 DIAGNOSIS — E041 Nontoxic single thyroid nodule: Secondary | ICD-10-CM

## 2023-02-01 ENCOUNTER — Other Ambulatory Visit (HOSPITAL_COMMUNITY)
Admission: RE | Admit: 2023-02-01 | Discharge: 2023-02-01 | Disposition: A | Discharge status: Home / Self Care | Payer: Commercial Managed Care - PPO | Source: Ambulatory Visit

## 2023-02-01 ENCOUNTER — Ambulatory Visit
Admission: RE | Admit: 2023-02-01 | Discharge: 2023-02-01 | Disposition: A | Discharge status: Home / Self Care | Payer: Commercial Managed Care - PPO | Source: Ambulatory Visit | Attending: Family Medicine | Admitting: Family Medicine

## 2023-02-01 DIAGNOSIS — E041 Nontoxic single thyroid nodule: Secondary | ICD-10-CM | POA: Insufficient documentation

## 2023-02-08 LAB — CYTOLOGY - NON PAP

## 2023-09-17 NOTE — Therapy (Unsigned)
 OUTPATIENT PHYSICAL THERAPY FEMALE PELVIC EVALUATION   Patient Name: Christina Logan MRN: [id number] DOB:25-Jan-1966, 58 y.o., female Today's Date: 09/18/2023  END OF SESSION:  PT End of Session - 09/18/23 0836     Visit Number 1    Date for PT Re-Evaluation 03/19/24    Authorization Type [employer]    PT Start Time 0830    PT Stop Time 0915    PT Time Calculation (min) 45 min    Activity Tolerance Patient tolerated treatment well    Behavior During Therapy [facility] for tasks assessed/performed             Past Medical History:  Diagnosis Date   Allergic rhinitis    Anxiety    Depression    Dysrhythmia    occ PVC's, PAC's   Personal history of breast cancer 08/11/2021   PVCs (premature ventricular contractions)    Ulcerative colitis (HCC)    Past Surgical History:  Procedure Laterality Date   APPLICATION OF A-CELL OF CHEST/ABDOMEN Bilateral 04/18/2022   Procedure: ACELLULAR DERMIS;  Surgeon: Glenna Fellows, MD;  Location: [facility];  Service: Plastics;  Laterality: Bilateral;   BREAST IMPLANT EXCHANGE Bilateral 02/22/2016   Procedure: REVISION OF RECONSTRUCTIVE BILATERAL BREAST WITH SILCONE BREAST IMPLANT EXCHANGE;  Surgeon: Glenna Fellows, MD;  Location: [facility];  Service: Plastics;  Laterality: Bilateral;   BREAST IMPLANT EXCHANGE Bilateral 04/03/2017   Procedure: REVISION bilateral  BREAST RECONSTRUCTION WITH SILICONE IMPLANT EXCHANGE AND ALLODERM;  Surgeon: Glenna Fellows, MD;  Location: [facility];  Service: Plastics;  Laterality: Bilateral;   BREAST IMPLANT EXCHANGE Bilateral 04/18/2022   Procedure: REVISION BREAST RECONSTRUCTION WITH SILICONE IMPLANT EXCHANGE;  Surgeon: Glenna Fellows, MD;  Location: [facility];  Service: Plastics;  Laterality: Bilateral;   BREAST RECONSTRUCTION WITH PLACEMENT OF TISSUE EXPANDER AND FLEX HD (ACELLULAR HYDRATED DERMIS) Bilateral 06/29/2015   Procedure: BILATERAL  BREAST RECONSTRUCTION WITH PLACEMENT OF TISSUE EXPANDER AND FLEX HD (ACELLULAR HYDRATED DERMIS);  Surgeon: Glenna Fellows, MD;  Location: [facility];  Service: Plastics;  Laterality: Bilateral;   COLONOSCOPY     2011/2012   laproscopic exam for fertility     LIPOSUCTION WITH LIPOFILLING Bilateral 09/28/2015   Procedure: LIPO FILLING FROM ABDOMEN TO BILATERAL CHEST;  Surgeon: Glenna Fellows, MD;  Location: [facility];  Service: Plastics;  Laterality: Bilateral;   LIPOSUCTION WITH LIPOFILLING Bilateral 02/22/2016   Procedure: LIPOFILLING TO BILATERAL CHEST;  Surgeon: Glenna Fellows, MD;  Location: [facility];  Service: Plastics;  Laterality: Bilateral;   NIPPLE SPARING MASTECTOMY/SENTINAL LYMPH NODE BIOPSY/RECONSTRUCTION/PLACEMENT OF TISSUE EXPANDER Bilateral 06/29/2015   Procedure: RIGHT NIPPLE SPARING MASTECTOMY WITH SENTINAL LYMPH NODE BIOPSY AND LEFT PROPHYLACTIC NIPPLE SPARING MASTECTOMY;  Surgeon: Emelia Loron, MD;  Location: [facility];  Service: General;  Laterality: Bilateral;   REMOVAL OF BILATERAL TISSUE EXPANDERS WITH PLACEMENT OF BILATERAL BREAST IMPLANTS Bilateral 09/28/2015   Procedure: REMOVAL OF BILATERAL TISSUE EXPANDERS WITH PLACEMENT OF BILATERAL BREAST IMPLANTS;  Surgeon: Glenna Fellows, MD;  [facility];  Service: Plastics;  Laterality: Bilateral;   right foot surgery     Patient Active Problem List   Diagnosis Date Noted   Genetic testing 08/19/2021   Personal history of breast cancer 08/11/2021   Breast cancer, right breast (HCC) 08/02/2015   S/P mastectomy 06/29/2015   Ductal carcinoma in situ (DCIS) of right breast 06/12/2015   Palpitations 01/21/2014   Family history of breast cancer  10/02/2011    PCP: Ronna Coho, MD  REFERRING PROVIDER: Estle Hemp, PA-C   REFERRING DIAG:  R10.2 (ICD-10-CM) - Pelvic pain syndrome  D39.12 (ICD-10-CM) - Ovarian tumor of  borderline malignancy, left    THERAPY DIAG:  Muscle weakness (generalized)  Other lack of coordination  Left lower quadrant abdominal pain  Rationale for Evaluation and Treatment: Rehabilitation  ONSET DATE: 2023  SUBJECTIVE:                                                                                                                                                                                           SUBJECTIVE STATEMENT: Patient had a hysterectomy in 2023 due to an ovarian tumor. Patient has now went through menopause. I have some pain in the spot the ovary was removed. She has worse stress incontinence. Patient on the estradiol Fluid intake: tea or water  PAIN:  Are you having pain? Yes NPRS scale: 3/10 Pain location:  left lower abdominal   Pain type: dull and sharp Pain description: constant   Aggravating factor: random pain Relieving factors: nothing  PRECAUTIONS: Other: breast cancer  RED FLAGS: None   WEIGHT BEARING RESTRICTIONS: No  FALLS:  Has patient fallen in last 6 months? Yes. Number of falls twisted ankle on a dog toy. Not due to balance  OCCUPATION: water aerobic teacher  ACTIVITY LEVEL : water aerobics   PLOF: Independent  PATIENT GOALS: reduce leakage  PERTINENT HISTORY:  Breast cancer; ulcerative colitis, hysterectomy Sexual abuse: No  BOWEL MOVEMENT:no issues  URINATION: Pain with urination: No Fully empty bladder: Yes:   Stream: Strong, when has urgency will go often and stream is shorter Urgency: Yes , with exercise Frequency: every 2-3 hours Leakage: Urge to void, Walking to the bathroom, Coughing, Sneezing, Laughing, Exercise, Lifting, and Bending forward Pads: No  INTERCOURSE:  Ability to have vaginal penetration Yes  Dryness: Yes   PREGNANCY: none   PROLAPSE: None   OBJECTIVE:  Note: Objective measures were completed at Evaluation unless otherwise noted.  DIAGNOSTIC FINDINGS:  none  PATIENT SURVEYS:   UIQ-7: : 33 PFIQ-7: 22  COGNITION: Overall cognitive status: Within functional limits for tasks assessed     SENSATION: Light touch: Appears intact   POSTURE: No Significant postural limitations   LUMBARAROM/PROM: lumbar ROM is full   LOWER EXTREMITY [id number] bilateral hip ROM    LOWER EXTREMITY MMT:  MMT Right eval Left eval  Hip extension 4/5 4/5  Hip abduction 4/5 4/5   (Blank rows = not tested) PALPATION:  Pelvic Alignment: left ilium is posteriorly rotated, decreased movement of left SI joint  Abdominal: decreased  lower rib cage mobility, tenderness in left lower quadrant.                 External Perineal Exam: intact                             Internal Pelvic Floor: tightness in the levator ani, tenderness located on the left anterior wall of the vagina with fascial restrictions,   Patient confirms identification and approves PT to assess internal pelvic floor and treatment Yes  PELVIC MMT:   MMT eval  Vaginal 2/5  (Blank rows = not tested)        TONE: increased  PROLAPSE: none  TODAY'S TREATMENT:                                                                                                                              DATE: 09/18/23  EVAL Examination completed, findings reviewed, pt educated on POC, HEP, and female pelvic floor anatomy, reasoning with pelvic floor assessment internally with pt consent, . Pt motivated to participate in PT and agreeable to attempt recommendations.     PATIENT EDUCATION:  Education details: Access Code: [email or web address], educated patient about vaginal moisturizers Person educated: Patient Education method: Explanation, Demonstration, Tactile cues, Verbal cues, and Handouts Education comprehension: verbalized understanding, returned demonstration, verbal cues required, tactile cues required, and needs further education  HOME EXERCISE PROGRAM: 09/18/23 Access Code: [email or web address] URL:  [email or web address] Date: 09/18/2023 Prepared by: Marsha Skeen  Program Notes Massage the pelvic floor every other day with opening up and rubbing the sides of the vaginal canal 10 times each side.   Exercises - Supine Diaphragmatic Breathing  - 3 x daily - 7 x weekly - 1 sets - 10 reps  ASSESSMENT:  CLINICAL IMPRESSION: Patient is a 58 y.o. female who was seen today for physical therapy evaluation and treatment for pelvic floor dysfunction. She had an ovarian tumor that let so a hysterectomy 2023 and having symptoms of menopause and urinary leakage. She will leak urine with urge to void, Walking to the bathroom, coughing, sneezing, laughing, exercise, lifting, and bending forward. Pelvic floor strength is 2/5 with difficulty relaxing. She has tightness in the introitus and levator ani and tenderness located in the left anterior vaginal canal where her ovary was removed. Decreased lower rib cage movement. Patient will benefit from skilled therapy to improve pelvic floor coordination and strength and reduce the left lower quadrant pain.   OBJECTIVE IMPAIRMENTS: decreased coordination, decreased strength, increased fascial restrictions, and increased muscle spasms.   ACTIVITY LIMITATIONS: lifting, bending, continence, and locomotion level  PARTICIPATION LIMITATIONS: community activity  PERSONAL FACTORS: Time since onset of injury/illness/exacerbation and 1-2 comorbidities: Breast cancer; ulcerative colitis, hysterectomy  are also affecting patient's functional outcome.   REHAB POTENTIAL: Excellent  CLINICAL DECISION MAKING: Evolving/moderate complexity  EVALUATION COMPLEXITY: Moderate   GOALS: Goals reviewed with patient? Yes  SHORT TERM GOALS: Target  date: 10/16/23  Patient is independent with initial HEP for pelvic floor strength.  Baseline: Goal status: INITIAL  2.  Patient is able to perform diaphragmatic breathing to relax the pelvic floor.  Baseline:  Goal  status: INITIAL  3.  Patient educated on the urge to void.  Baseline:  Goal status: INITIAL  4.  Patient educated on the effects of menopause on the pelvic floor and how to manage it.  Baseline:  Goal status: INITIAL  LONG TERM GOALS: Target date: 03/19/24  Patient is independent with advanced HEP for core and pelvic floor.  Baseline:  Goal status: INITIAL  2.  Pelvic strength >/4/5 to reduce urinary leakage with lifting and using correct pressure management.  Baseline:  Goal status: INITIAL  3.  Patient is able to walk to the bathroom after she has the urge to void and not leak urine due to increased pelvic floor strength >/= 4/5. Baseline:  Goal status: INITIAL  4.  Patient is able to fully relax her pelvic floor and quickly contract to reduce urinary leakage with coughing and sneezing >/= 80%.  Baseline:  Goal status: INITIAL   PLAN:  PT FREQUENCY: 1-2x/week  PT DURATION: 6 months  PLANNED INTERVENTIONS: 97110-Therapeutic exercises, 97530- Therapeutic activity, 97112- Neuromuscular re-education, 97535- Self Care, [id number]- Manual therapy, Patient/Family education, Dry Needling, Joint mobilization, Spinal mobilization, Cryotherapy, Moist heat, and Biofeedback  PLAN FOR NEXT SESSION: manual work to the pelvic floor, manual work to the lower rib to work on rib expansion, review vaginal moisturizers, urge to void   Marsha Skeen, PT 09/18/23 1:01 PM

## 2023-09-18 ENCOUNTER — Encounter: Payer: Self-pay | Admitting: Physical Therapy

## 2023-09-18 ENCOUNTER — Encounter: Payer: Commercial Managed Care - PPO | Attending: Physician Assistant | Admitting: Physical Therapy

## 2023-09-18 ENCOUNTER — Other Ambulatory Visit: Payer: Self-pay

## 2023-09-18 DIAGNOSIS — M6281 Muscle weakness (generalized): Secondary | ICD-10-CM | POA: Insufficient documentation

## 2023-09-18 DIAGNOSIS — R278 Other lack of coordination: Secondary | ICD-10-CM | POA: Diagnosis present

## 2023-09-18 DIAGNOSIS — R1032 Left lower quadrant pain: Secondary | ICD-10-CM | POA: Insufficient documentation

## 2023-09-18 NOTE — Patient Instructions (Signed)
 Moisturizers They are used in the vagina to hydrate the mucous membrane that make up the vaginal canal. Designed to keep a more normal acid balance (ph) Once placed in the vagina, it will last between two to three days.  Use 2-3 times per week at bedtime  Ingredients to avoid is glycerin and fragrance, can increase chance of infection Should not be used just before sex due to causing irritation Most are gels administered either in a tampon-shaped applicator or as a vaginal suppository. They are non-hormonal.   Types of Moisturizers(internal use)  Vitamin E vaginal suppositories- Whole foods, Amazon Moist Again Julva- (Do no use if taking  Tamoxifen) amazon Yes moisturizer- amazon NeuEve Silk , NeuEve Silver for menopausal or over 65 (if have severe vaginal atrophy or cancer treatments use NeuEve Silk for  1 month than move to [employer])- [employer], Kelliher.com Olive and Bee intimate cream- www.oliveandbee.com.au Mae vaginal moisturizer- Amazon Aloe Good Clean Love Hyaluronic acid Hyalofemme Reveree hyaluronic acid inserts   Creams to use externally on the Vulva area Marathon Oil (good for for cancer patients that had radiation to the area)- [place] or Newell Rubbermaid.[email or web address]://garcia-[email or web address]/ Vulva Balm/ V-magic cream by medicine mama- amazon Julva-amazon Vital "V Wild Yam salve ( help moisturize and help with thinning vulvar area, does have Beeswax MoodMaid Botanical Pro-Meno Wild Yam Cream- Amazon Desert Harvest Gele Cleo by Derenda Flax labial moisturizer (Amazon),  aloe Good Clean Love Enchanted Rose by intimate rose Momotoro  Things to avoid in the vaginal area Do not use things to irritate the vulvar area No lotions just specialized creams for the vulva area- Neogyn, V-magic,  No soaps; can use Aveeno or Calendula cleanser, unscented Dove if needed. Must be gentle No deodorants No douches Good to sleep without underwear to let the vaginal area to air out No scrubbing: spread the  lips to let warm water rinse over labias and pat dry  Marsha Skeen, PT [facility] [address], [address] [place], [place] [address] W: [phone] [email or web address]

## 2023-09-25 ENCOUNTER — Encounter: Payer: Self-pay | Admitting: Physical Therapy

## 2023-09-25 ENCOUNTER — Encounter: Payer: Commercial Managed Care - PPO | Admitting: Physical Therapy

## 2023-09-25 DIAGNOSIS — R278 Other lack of coordination: Secondary | ICD-10-CM

## 2023-09-25 DIAGNOSIS — M6281 Muscle weakness (generalized): Secondary | ICD-10-CM

## 2023-09-25 DIAGNOSIS — R1032 Left lower quadrant pain: Secondary | ICD-10-CM

## 2023-09-25 NOTE — Patient Instructions (Addendum)
 Lubrication Used for intercourse to reduce friction Avoid ones that have glycerin, nonoxynol-9, petroleum, propylene glycol, chlorhexidine  gluconate, warming gels, tingling gels, icing or cooling gel, scented Avoid parabens due to a preservative similar to female sex hormone May need to be reapplied once or several times during sexual activity Can be applied to both partners genitals prior to vaginal penetration to minimize friction or irritation Prevent irritation and mucosal tears that cause post coital pain and increased the risk of vaginal and urinary tract infections Oil-based lubricants cannot be used with condoms due to breaking them down.  Least likely to irritate vaginal tissue.  Plant based-lubes are safe Silicone-based lubrication are thicker and last long and used for post-menopausal women  Vaginal Lubricators Here is a list of some suggested lubricators you can use for intercourse. Use the most hypoallergenic product.  You can place on you or your partner.  Slippery Stuff ( water based) Sylk or Sliquid Natural H2O ( good  if frequent UTI's)- walmart, amazon Sliquid organics silk-(aloe and silicone based ) Blossom Organics (www.blossom-organics.com)- (aloe based ) PJur Woman Nude- (water based) Sherree Doctor- ( silicon) Amazon Aloe Vera- Sprouts has an organic one Yes lubricant- (water based and has plant oil based similar to silicone) Loews Corporation Platinum-Silicone, Target, Walgreens Olive and Bee intimate cream-  www.oliveandbee.com.au Pink - International Paper Erosense Sync- walmart, amazon Coconu- coconu.com Desert Hallibur[employer] Company Good Clean Love lubricants  Things to avoid in lubricants are glycerin, warming gels, tingling gels, icing or cooling  gels, and scented gels.  Also avoid Vaseline. KY jelly,  and Astroglide contain chlorhexidine  which kills good bacteria(lactobacilli)  Things to avoid in the vaginal area Do not use things to irritate the vulvar area No lotions-  see below Soaps you  can use :Aveeno, Calendula, Good Clean Love cleanser if needed. Must be gentle No deodorants No douches Good to sleep without underwear to let the vaginal area to air out No scrubbing: spread the lips to let warm water rinse over labias and pat dry  Creams that can be used on the Vulva Area V [employer] Group, walmart Vital V Wild Yam Salve Julva- Amazon MoonMaid Botanical Pro-Meno Wild Yam Cream Coconut oil, olive oil Cleo by [employer] labial moisturizer -Amazon,  Desert CIGNA ( lidocaine ) or Desert Harvest Gele Yes Moisturizer    Urge Incontinence  Ideal urination frequency is every 2-4 wakeful hours, which equates to 5-8 times within a 24-hour period.   Urge incontinence is leakage that occurs when the bladder muscle contracts, creating a sudden need to go before getting to the bathroom.   Going too often when your bladder isn't actually full can disrupt the body's automatic signals to store and hold urine longer, which will increase urgency/frequency.  In this case, the bladder "is running the show" and strategies can be learned to retrain this pattern.   One should be able to control the first urge to urinate, at around .  The bladder can hold up to a "grande latte," or . To help you gain control, practice the Urge Drill below when urgency strikes.  This drill will help retrain your bladder signals and allow you to store and hold urine longer.  The overall goal is to stretch out your time between voids to reach a more manageable voiding schedule.    Practice your "quick flicks" often throughout the day (each waking hour) even when you don't need feel the urge to go.  This will help strengthen your pelvic floor muscles, making  them more effective in controlling leakage.  Urge Drill  When you feel an urge to go, follow these steps to regain control: Stop what you are doing and be still Take one deep breath, directing your air into your abdomen Think  an affirming thought, such as "I've got this." Do 5 quick flicks of your pelvic floor Hit heels 5 times hard in sitting or standing Walk with control to the bathroom to void, or delay voiding If the urge comes back then repeat.   Bladder Irritants  Certain foods and beverages can be irritating to the bladder.  Avoiding these irritants may decrease your symptoms of urinary urgency, frequency or bladder pain.  Even reducing your intake can help with your symptoms.  Not everyone is sensitive to all bladder irritants, so you may consider focusing on one irritant at a time, removing or reducing your intake of that irritant for 7-10 days to see if this change helps your symptoms.  Water intake is also very important.  Below is a list of bladder irritants.  Drinks: alcohol, carbonated beverages, caffeinated beverages such as coffee and tea, drinks with artificial sweeteners, citrus juices, apple juice, tomato juice  Foods: tomatoes and tomato based foods, spicy food, sugar and artificial sweeteners, vinegar, chocolate, raw onion, apples, citrus fruits, pineapple, cranberries, tomatoes, strawberries, plums, peaches, cantaloupe  Other: acidic urine (too concentrated) - see water intake info below  Substitutes you can try that are NOT irritating to the bladder: cooked onion, pears, papayas, sun-brewed decaf teas, watermelons, non-citrus herbal teas, apricots, kava and low-acid instant drinks (Postum).    WATER INTAKE: Remember to drink lots of water (aim for fluid intake of half your body weight with 2/3 of fluids being water).  You may be limiting fluids due to fear of leakage, but this can actually worsen urgency symptoms due to highly concentrated urine.  Water helps balance the pH of your urine so it doesn't become too acidic - acidic urine is a bladder irritant!  Marsha Skeen, PT [facility] [address], [address] [place], [place] [address] W:  [phone] [email or web address]

## 2023-09-25 NOTE — Therapy (Signed)
 OUTPATIENT PHYSICAL THERAPY FEMALE PELVIC TREATMENT   Patient Name: Christina Logan MRN: [id number] DOB:[birth year], 58 y.o., female Today's Date: 09/25/2023  END OF SESSION:  PT End of Session - 09/25/23 0835     Visit Number 2    Date for PT Re-Evaluation 03/19/24    Authorization Type UHC    Authorization - Visit Number 2    Authorization - Number of Visits 30    PT Start Time 0830    PT Stop Time 0915    PT Time Calculation (min) 45 min    Activity Tolerance Patient tolerated treatment well    Behavior During Therapy [place] for tasks assessed/performed             Past Medical History:  Diagnosis Date   Allergic rhinitis    Anxiety    Depression    Dysrhythmia    occ PVC's, PAC's   Personal history of breast cancer 08/11/2021   PVCs (premature ventricular contractions)    Ulcerative colitis (HCC)    Past Surgical History:  Procedure Laterality Date   APPLICATION OF A-CELL OF CHEST/ABDOMEN Bilateral 04/18/2022   Procedure: ACELLULAR DERMIS;  Surgeon: Alger Infield, MD;  Location: [facility];  Service: Plastics;  Laterality: Bilateral;   BREAST IMPLANT EXCHANGE Bilateral 02/22/2016   Procedure: REVISION OF RECONSTRUCTIVE BILATERAL BREAST WITH SILCONE BREAST IMPLANT EXCHANGE;  Surgeon: Alger Infield, MD;  Location: [facility];  Service: Plastics;  Laterality: Bilateral;   BREAST IMPLANT EXCHANGE Bilateral 04/03/2017   Procedure: REVISION bilateral  BREAST RECONSTRUCTION WITH SILICONE IMPLANT EXCHANGE AND ALLODERM;  Surgeon: Alger Infield, MD;  Location: [facility];  Service: Plastics;  Laterality: Bilateral;   BREAST IMPLANT EXCHANGE Bilateral 04/18/2022   Procedure: REVISION BREAST RECONSTRUCTION WITH SILICONE IMPLANT EXCHANGE;  Surgeon: Alger Infield, MD;  Location: [facility];  Service: Plastics;  Laterality: Bilateral;   BREAST RECONSTRUCTION WITH PLACEMENT OF TISSUE EXPANDER AND FLEX HD  (ACELLULAR HYDRATED DERMIS) Bilateral 06/29/2015   Procedure: BILATERAL BREAST RECONSTRUCTION WITH PLACEMENT OF TISSUE EXPANDER AND FLEX HD (ACELLULAR HYDRATED DERMIS);  Surgeon: Alger Infield, MD;  Location: [facility];  Service: Plastics;  Laterality: Bilateral;   COLONOSCOPY     2011/2012   laproscopic exam for fertility     LIPOSUCTION WITH LIPOFILLING Bilateral 09/28/2015   Procedure: LIPO FILLING FROM ABDOMEN TO BILATERAL CHEST;  Surgeon: Alger Infield, MD;  Location: [facility];  Service: Plastics;  Laterality: Bilateral;   LIPOSUCTION WITH LIPOFILLING Bilateral 02/22/2016   Procedure: LIPOFILLING TO BILATERAL CHEST;  Surgeon: Alger Infield, MD;  Location: [facility];  Service: Plastics;  Laterality: Bilateral;   NIPPLE SPARING MASTECTOMY/SENTINAL LYMPH NODE BIOPSY/RECONSTRUCTION/PLACEMENT OF TISSUE EXPANDER Bilateral 06/29/2015   Procedure: RIGHT NIPPLE SPARING MASTECTOMY WITH SENTINAL LYMPH NODE BIOPSY AND LEFT PROPHYLACTIC NIPPLE SPARING MASTECTOMY;  Surgeon: Enid Harry, MD;  Location: [facility];  Service: General;  Laterality: Bilateral;   REMOVAL OF BILATERAL TISSUE EXPANDERS WITH PLACEMENT OF BILATERAL BREAST IMPLANTS Bilateral 09/28/2015   Procedure: REMOVAL OF BILATERAL TISSUE EXPANDERS WITH PLACEMENT OF BILATERAL BREAST IMPLANTS;  Surgeon: Alger Infield, MD;  Location: [facility];  Service: Plastics;  Laterality: Bilateral;   right foot surgery     Patient Active Problem List   Diagnosis Date Noted   Genetic testing 08/19/2021   Personal history of breast cancer 08/11/2021   Breast cancer, right breast (HCC) 08/02/2015   S/P mastectomy 06/29/2015   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   Palpitations 01/21/2014   Family history of breast cancer 10/02/2011    PCP: Ronna Coho, MD  REFERRING PROVIDER: Estle Hemp, PA-C   REFERRING DIAG:  R10.2  (ICD-10-CM) - Pelvic pain syndrome  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  Fluid intake: tea or water  PAIN:  Are you having pain? Yes NPRS scale: 3/10 Pain location:  left lower abdominal   Pain type: dull and sharp Pain description: constant   Aggravating factor: random pain Relieving factors: nothing  PRECAUTIONS: Other: breast cancer  RED FLAGS: None   WEIGHT BEARING RESTRICTIONS: No  FALLS:  Has patient fallen in last 6 months? Yes. Number of falls twisted ankle on a dog toy. Not due to balance  OCCUPATION: water aerobic teacher  ACTIVITY LEVEL : water aerobics   PLOF: Independent  PATIENT GOALS: reduce leakage  PERTINENT HISTORY:  Breast cancer; ulcerative colitis, hysterectomy Sexual abuse: No  BOWEL MOVEMENT:no issues  URINATION: Pain with urination: No Fully empty bladder: Yes:   Stream: Strong, when has urgency will go often and stream is shorter Urgency: Yes , with exercise Frequency: every 2-3 hours Leakage: Urge to void, Walking to the bathroom, Coughing, Sneezing, Laughing, Exercise, Lifting, and Bending forward Pads: No  INTERCOURSE:  Ability to have vaginal penetration Yes  Dryness: Yes   PREGNANCY: none   PROLAPSE: None   OBJECTIVE:  Note: Objective measures were completed at Evaluation unless otherwise noted.  DIAGNOSTIC FINDINGS:  none  PATIENT SURVEYS:  UIQ-7: : 33 PFIQ-7: 22  COGNITION: Overall cognitive status: Within functional limits for tasks  assessed     SENSATION: Light touch: Appears intact   POSTURE: No Significant postural limitations   LUMBARAROM/PROM: lumbar ROM is full   LOWER EXTREMITY ZOX:WRUE bilateral hip ROM    LOWER EXTREMITY MMT:  MMT Right eval Left eval  Hip extension 4/5 4/5  Hip abduction 4/5 4/5   (Blank rows = not tested) PALPATION:  Pelvic Alignment: left ilium is posteriorly rotated, decreased movement of left SI joint  Abdominal: decreased lower rib cage mobility, tenderness in left lower quadrant.  External Perineal Exam: intact                             Internal Pelvic Floor: tightness in the levator ani, tenderness located on the left anterior wall of the vagina with fascial restrictions,   Patient confirms identification and approves PT to assess internal pelvic floor and treatment Yes  PELVIC MMT:   MMT eval  Vaginal 2/5  (Blank rows = not tested)        TONE: increased  PROLAPSE: none  TODAY'S TREATMENT:      09/25/23 Manual: Soft tissue mobilization: Laying on foam roll on the right side with therapist working on the low lateral intercostals, quadratus, diaphragm, left lateral abdominal tissue and tissue rolling to lengthen Exercises: Stretches/mobility: Sitting on ball with stretching the lower rib cage and working on the quadratus to lengthen the area and increase rib mobility Childs pose with foam roll to stretch the ribs and lats Seated lateral trunk stretch  Diaphragmatic breathing to expand the lower rib cage and relax the pelvic floor in sitting Self-care: Educated patient on vaginal moisturizers and lubricants for vaginal health. Went over on what the products should not have. The differences between silicon and water based lubricant.  Educated patient on bladder irritants and how they affect the bladder                                                                                                                            DATE:  09/18/23  EVAL Examination completed, findings reviewed, pt educated on POC, HEP, and female pelvic floor anatomy, reasoning with pelvic floor assessment internally with pt consent, . Pt motivated to participate in PT and agreeable to attempt recommendations.     PATIENT EDUCATION:  09/25/23 Education details: Access Code: [email or web address], educated patient about vaginal moisturizers and lubricants Person educated: Patient Education method: Explanation, Demonstration, Tactile cues, Verbal cues, and Handouts Education comprehension: verbalized understanding, returned demonstration, verbal cues required, tactile cues required, and needs further education  HOME EXERCISE PROGRAM: 09/25/23 Access Code: [email or web address] URL: [email or web address] Date: 09/25/2023 Prepared by: Marsha Skeen  Program Notes Massage the pelvic floor every other day with opening up and rubbing the sides of the vaginal canal 10 times each side.   Exercises - Supine Diaphragmatic Breathing  - 3 x daily - 7 x weekly - 1 sets - 10 reps - Seated Diaphragmatic Breathing  - 1 x daily - 7 x weekly - 1 sets - 10 reps - Seated Lateral Trunk Stretch on Swiss Ball  - 1 x daily - 7 x weekly - 1 sets - 2 reps - 30 sec hold - Thoracic Extension with Foam Roll  - 1 x daily - 7 x weekly - 1 sets - 1 reps - 30 sec hold  ASSESSMENT:  CLINICAL IMPRESSION: Patient is a 58 y.o. female who was seen today for physical therapy  treatment for pelvic  floor dysfunction. She had an ovarian tumor that let so a hysterectomy 2023 and having symptoms of menopause and urinary leakage. Patient was able to perform diaphragmatic breathing and feel the pelvic floor relax. Patient has improved lateral trunk mobility. She understands the urge to void drill. Patient will benefit from skilled therapy to improve pelvic floor coordination and strength and reduce the left lower quadrant pain.   OBJECTIVE IMPAIRMENTS: decreased coordination, decreased strength,  increased fascial restrictions, and increased muscle spasms.   ACTIVITY LIMITATIONS: lifting, bending, continence, and locomotion level  PARTICIPATION LIMITATIONS: community activity  PERSONAL FACTORS: Time since onset of injury/illness/exacerbation and 1-2 comorbidities: Breast cancer; ulcerative colitis, hysterectomy  are also affecting patient's functional outcome.   REHAB POTENTIAL: Excellent  CLINICAL DECISION MAKING: Evolving/moderate complexity  EVALUATION COMPLEXITY: Moderate   GOALS: Goals reviewed with patient? Yes  SHORT TERM GOALS: Target date: 10/16/23  Patient is independent with initial HEP for pelvic floor strength.  Baseline: Goal status: INITIAL  2.  Patient is able to perform diaphragmatic breathing to relax the pelvic floor.  Baseline:  Goal status: Met 09/25/23  3.  Patient educated on the urge to void.  Baseline:  Goal status: Met 09/25/23  4.  Patient educated on the effects of menopause on the pelvic floor and how to manage it.  Baseline:  Goal status: INITIAL  LONG TERM GOALS: Target date: 03/19/24  Patient is independent with advanced HEP for core and pelvic floor.  Baseline:  Goal status: INITIAL  2.  Pelvic strength >/4/5 to reduce urinary leakage with lifting and using correct pressure management.  Baseline:  Goal status: INITIAL  3.  Patient is able to walk to the bathroom after she has the urge to void and not leak urine due to increased pelvic floor strength >/= 4/5. Baseline:  Goal status: INITIAL  4.  Patient is able to fully relax her pelvic floor and quickly contract to reduce urinary leakage with coughing and sneezing >/= 80%.  Baseline:  Goal status: INITIAL   PLAN:  PT FREQUENCY: 1-2x/week  PT DURATION: 6 months  PLANNED INTERVENTIONS: 97110-Therapeutic exercises, 97530- Therapeutic activity, 97112- Neuromuscular re-education, 97535- Self Care, 40981- Manual therapy, Patient/Family education, Dry Needling, Joint  mobilization, Spinal mobilization, Cryotherapy, Moist heat, and Biofeedback  PLAN FOR NEXT SESSION: manual work to the pelvic floor,  see how the  urge to void, core exercise   Marsha Skeen, PT 09/25/23 9:32 AM

## 2023-10-02 ENCOUNTER — Encounter: Payer: Self-pay | Admitting: Physical Therapy

## 2023-10-02 ENCOUNTER — Encounter: Payer: Commercial Managed Care - PPO | Admitting: Physical Therapy

## 2023-10-02 DIAGNOSIS — M6281 Muscle weakness (generalized): Secondary | ICD-10-CM

## 2023-10-02 DIAGNOSIS — R278 Other lack of coordination: Secondary | ICD-10-CM

## 2023-10-02 DIAGNOSIS — R1032 Left lower quadrant pain: Secondary | ICD-10-CM

## 2023-10-02 NOTE — Therapy (Signed)
 OUTPATIENT PHYSICAL THERAPY FEMALE PELVIC TREATMENT   Patient Name: Christina Logan MRN: [id number] DOB:1966-04-30, 58 y.o., female Today's Date: 10/02/2023  END OF SESSION:  PT End of Session - 10/02/23 0834     Visit Number 3    Date for PT Re-Evaluation 03/19/24    Authorization Type [employer]    Authorization - Visit Number 3    Authorization - Number of Visits 30    PT Start Time 0830    PT Stop Time 0915    PT Time Calculation (min) 45 min    Activity Tolerance Patient tolerated treatment well    Behavior During Therapy [facility] for tasks assessed/performed             Past Medical History:  Diagnosis Date   Allergic rhinitis    Anxiety    Depression    Dysrhythmia    occ PVC's, PAC's   Personal history of breast cancer 08/11/2021   PVCs (premature ventricular contractions)    Ulcerative colitis (HCC)    Past Surgical History:  Procedure Laterality Date   APPLICATION OF A-CELL OF CHEST/ABDOMEN Bilateral 04/18/2022   Procedure: ACELLULAR DERMIS;  Surgeon: Alger Infield, MD;  Location: [facility];  Service: Plastics;  Laterality: Bilateral;   BREAST IMPLANT EXCHANGE Bilateral 02/22/2016   Procedure: REVISION OF RECONSTRUCTIVE BILATERAL BREAST WITH SILCONE BREAST IMPLANT EXCHANGE;  Surgeon: Alger Infield, MD;  Location: [facility];  Service: Plastics;  Laterality: Bilateral;   BREAST IMPLANT EXCHANGE Bilateral 04/03/2017   Procedure: REVISION bilateral  BREAST RECONSTRUCTION WITH SILICONE IMPLANT EXCHANGE AND ALLODERM;  Surgeon: Alger Infield, MD;  Location: [facility];  Service: Plastics;  Laterality: Bilateral;   BREAST IMPLANT EXCHANGE Bilateral 04/18/2022   Procedure: REVISION BREAST RECONSTRUCTION WITH SILICONE IMPLANT EXCHANGE;  Surgeon: Alger Infield, MD;  Location: [facility];  Service: Plastics;  Laterality: Bilateral;   BREAST RECONSTRUCTION WITH PLACEMENT OF TISSUE EXPANDER AND FLEX HD  (ACELLULAR HYDRATED DERMIS) Bilateral 06/29/2015   Procedure: BILATERAL BREAST RECONSTRUCTION WITH PLACEMENT OF TISSUE EXPANDER AND FLEX HD (ACELLULAR HYDRATED DERMIS);  Surgeon: Alger Infield, MD;  Location: [facility];  Service: Plastics;  Laterality: Bilateral;   COLONOSCOPY     2011/2012   laproscopic exam for fertility     LIPOSUCTION WITH LIPOFILLING Bilateral 09/28/2015   Procedure: LIPO FILLING FROM ABDOMEN TO BILATERAL CHEST;  Surgeon: Alger Infield, MD;  Location: [facility];  Service: Plastics;  Laterality: Bilateral;   LIPOSUCTION WITH LIPOFILLING Bilateral 02/22/2016   Procedure: LIPOFILLING TO BILATERAL CHEST;  Surgeon: Alger Infield, MD;  Location: [facility];  Service: Plastics;  Laterality: Bilateral;   NIPPLE SPARING MASTECTOMY/SENTINAL LYMPH NODE BIOPSY/RECONSTRUCTION/PLACEMENT OF TISSUE EXPANDER Bilateral 06/29/2015   Procedure: RIGHT NIPPLE SPARING MASTECTOMY WITH SENTINAL LYMPH NODE BIOPSY AND LEFT PROPHYLACTIC NIPPLE SPARING MASTECTOMY;  Surgeon: Enid Harry, MD;  Location: [facility];  Service: General;  Laterality: Bilateral;   REMOVAL OF BILATERAL TISSUE EXPANDERS WITH PLACEMENT OF BILATERAL BREAST IMPLANTS Bilateral 09/28/2015   Procedure: REMOVAL OF BILATERAL TISSUE EXPANDERS WITH PLACEMENT OF BILATERAL BREAST IMPLANTS;  Surgeon: Alger Infield, MD;  Location: [facility];  Service: Plastics;  Laterality: Bilateral;   right foot surgery     Patient Active Problem List   Diagnosis Date Noted   Genetic testing 08/19/2021   Personal history of breast cancer 08/11/2021   Breast cancer, right breast (HCC) 08/02/2015   S/P mastectomy 06/29/2015   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   Palpitations 01/21/2014   Family history of breast cancer 10/02/2011    PCP: Ronna Coho, MD  REFERRING PROVIDER: Estle Hemp, PA-C   REFERRING DIAG:  R10.2  (ICD-10-CM) - Pelvic pain syndrome  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   Fluid intake: tea or water  PAIN:  Are you having pain? Yes NPRS scale: 3/10 10/02/23: 2/10 Pain location:  left lower abdominal   Pain type: dull and sharp Pain description: constant   Aggravating factor: random pain Relieving factors: nothing  PRECAUTIONS: Other: breast cancer  RED FLAGS: None   WEIGHT BEARING RESTRICTIONS: No  FALLS:  Has patient fallen in last 6 months? Yes. Number of falls twisted ankle on a dog toy. Not due to balance  OCCUPATION: water aerobic teacher  ACTIVITY LEVEL : water aerobics   PLOF: Independent  PATIENT GOALS: reduce leakage  PERTINENT HISTORY:  Breast cancer; ulcerative colitis, hysterectomy Sexual abuse: No  BOWEL MOVEMENT:no issues  URINATION: Pain with urination: No Fully empty bladder: Yes:   Stream: Strong, when has urgency will go often and stream is shorter Urgency: Yes , with exercise Frequency: every 2-3 hours Leakage: Urge to void, Walking to the bathroom, Coughing, Sneezing, Laughing, Exercise, Lifting, and Bending forward Pads: No  INTERCOURSE:  Ability to have vaginal penetration Yes  Dryness: Yes   PREGNANCY: none   PROLAPSE: None   OBJECTIVE:  Note: Objective measures were completed at Evaluation unless otherwise noted.  DIAGNOSTIC FINDINGS:  none  PATIENT SURVEYS:  UIQ-7: : 33 PFIQ-7: 22  COGNITION: Overall cognitive status: Within functional  limits for tasks assessed     SENSATION: Light touch: Appears intact   POSTURE: No Significant postural limitations   LUMBARAROM/PROM: lumbar ROM is full   LOWER EXTREMITY [id number] bilateral hip ROM    LOWER EXTREMITY MMT:  MMT Right eval Left eval  Hip extension 4/5 4/5  Hip abduction 4/5 4/5   (Blank rows = not tested) PALPATION:  Pelvic Alignment: left ilium is posteriorly rotated, decreased movement of left SI joint  Abdominal: decreased lower rib cage mobility, tenderness in left lower quadrant.  External Perineal Exam: intact                             Internal Pelvic Floor: tightness in the levator ani, tenderness located on the left anterior wall of the vagina with fascial restrictions,   Patient confirms identification and approves PT to assess internal pelvic floor and treatment Yes  PELVIC MMT:   MMT eval 10/02/23  Vaginal 2/5 3/5 with difficulty with relaxing  (Blank rows = not tested)        TONE: increased  PROLAPSE: none  TODAY'S TREATMENT:    10/02/23 Manual: Internal pelvic floor techniques: No emotional/communication barriers or cognitive limitation. Patient is motivated to learn. Patient understands and agrees with treatment goals and plan. PT explains patient will be examined in standing, sitting, and lying down to see how their muscles and joints work. When they are ready, they will be asked to remove their underwear so PT can examine their perineum. The patient is also given the option of providing their own chaperone as one is not provided in our facility. The patient also has the right and is explained the right to defer or refuse any part of the evaluation or treatment including the internal exam. With the patient's consent, PT will use one gloved finger to gently assess the muscles of the pelvic floor, seeing how well it contracts and relaxes and if there is muscle symmetry. After, the patient will get dressed and PT and  patient will discuss exam findings and plan of care. PT and patient discuss plan of care, schedule, attendance policy and HEP activities.  Going through the vaginal canal with a gloved finger to perform manual work to the levator ani, along the introitus, using leg movements to stretch the hips at the same  time External release of the urogenital diaphragm going through the layers of restrictions Neuromuscular re-education: Pelvic floor contraction: Therapist finger in the vaginal canal working on pelvic floor contraction and pulling upward Down training: Diaphragmatic breathing with therapist finger in the vaginal canal working on breathing and relaxing the pelvic floor Lateral trunk stretch with diaphragmatic breathing to open up the side rib cage Childs pose with diaphragmatic breathing opening up the posterior rib cage Exercises: Stretches/mobility: Pigeon pose bil. Holding 30 sec Hip adductor stretch in long sit holding 30 sec Hip adductor stretch in kneeling holding 30 sec and move foot in and out bil.  Self-care: Discussed with patient on different moisturizers due to the v-magic irritated her skin      09/25/23 Manual: Soft tissue mobilization: Laying on foam roll on the right side with therapist working on the low lateral intercostals, quadratus, diaphragm, left lateral abdominal tissue and tissue rolling to lengthen Exercises: Stretches/mobility: Sitting on ball with stretching the lower rib cage and working on the quadratus to lengthen the area and increase rib mobility Childs pose with foam roll to stretch the ribs and lats Seated lateral trunk stretch  Diaphragmatic breathing to expand the lower rib cage and relax the pelvic floor in sitting Self-care: Educated patient on vaginal moisturizers and lubricants for vaginal health. Went over on what the products should not have. The differences between silicon and water based lubricant.  Educated patient on bladder irritants and  how they affect the bladder  DATE: 09/18/23  EVAL Examination completed, findings reviewed, pt educated on POC, HEP, and female pelvic floor anatomy, reasoning with pelvic floor assessment internally with pt consent, . Pt motivated to participate in PT and agreeable to attempt recommendations.     PATIENT EDUCATION:  10/02/23 Education details: Access Code: [email or web address], educated patient about vaginal moisturizers and lubricants Person educated: Patient Education method: Explanation, Demonstration, Tactile cues, Verbal cues, and Handouts Education comprehension: verbalized understanding, returned demonstration, verbal cues required, tactile cues required, and needs further education  HOME EXERCISE PROGRAM: 10/02/23 Access Code: [email or web address] URL: [email or web address] Date: 10/02/2023 Prepared by: Marsha Skeen  Program Notes Massage the pelvic floor every other day with opening up and rubbing the sides of the vaginal canal 10 times each side.   Exercises - Supine Diaphragmatic Breathing  - 3 x daily - 7 x weekly - 1 sets - 10 reps - Seated Diaphragmatic Breathing  - 1 x daily - 7 x weekly - 1 sets - 10 reps - Seated Lateral Trunk Stretch on Swiss Ball  - 1 x daily - 7 x weekly - 1 sets - 2 reps - 30 sec hold - Thoracic Extension with Foam Roll  - 1 x daily - 7 x weekly - 1 sets - 1 reps - 30 sec hold - Diaphragmatic Breathing in Child's Pose with Pelvic Floor Relaxation  - 1 x daily - 7 x weekly - 1 sets - 1 reps - Pigeon Pose  - 1 x daily - 7 x weekly - 1 sets - 1 reps - 30 sec hold - V Sit Hip Adductor Hamstring Stretch  - 1 x daily - 7 x weekly - 1 sets - 1 reps - 30 sec hold - Kneeling Adductor Stretch with Hip External Rotation  - 1 x daily - 7 x weekly - 1 sets - 1 reps - 30 sec hold  ASSESSMENT:  CLINICAL IMPRESSION: Patient is a 58 y.o. female who  was seen today for physical therapy  treatment for pelvic floor dysfunction. She had an ovarian tumor that let so a hysterectomy 2023 and having symptoms of menopause and urinary leakage. Pelvic floor strength increased to 3/5 with difficulty relaxing the muscles. She still is not fully relaxing the pelvic floor muscles with diaphragmatic breathing.  Patient will benefit from skilled therapy to improve pelvic floor coordination and strength and reduce the left lower quadrant pain.   OBJECTIVE IMPAIRMENTS: decreased coordination, decreased strength, increased fascial restrictions, and increased muscle spasms.   ACTIVITY LIMITATIONS: lifting, bending, continence, and locomotion level  PARTICIPATION LIMITATIONS: community activity  PERSONAL FACTORS: Time since onset of injury/illness/exacerbation and 1-2 comorbidities: Breast cancer; ulcerative colitis, hysterectomy  are also affecting patient's functional outcome.   REHAB POTENTIAL: Excellent  CLINICAL DECISION MAKING: Evolving/moderate complexity  EVALUATION COMPLEXITY: Moderate   GOALS: Goals reviewed with patient? Yes  SHORT TERM GOALS: Target date: 10/16/23  Patient is independent with initial HEP for pelvic floor strength.  Baseline: Goal status: INITIAL  2.  Patient is able to perform diaphragmatic breathing to relax the pelvic floor.  Baseline:  Goal status: Met 09/25/23  3.  Patient educated on the urge to void.  Baseline:  Goal status: Met 09/25/23  4.  Patient educated on the effects of menopause on the pelvic floor and how to manage it.  Baseline:  Goal status: INITIAL  LONG TERM GOALS: Target date: 03/19/24  Patient is independent with advanced HEP for core and pelvic floor.  Baseline:  Goal status: INITIAL  2.  Pelvic strength >/4/5 to reduce urinary leakage with lifting and using correct pressure management.  Baseline:  Goal status: INITIAL  3.  Patient is able to walk to the bathroom after she has the urge to  void and not leak urine due to increased pelvic floor strength >/= 4/5. Baseline:  Goal status: INITIAL  4.  Patient is able to fully relax her pelvic floor and quickly contract to reduce urinary leakage with coughing and sneezing >/= 80%.  Baseline:  Goal status: INITIAL   PLAN:  PT FREQUENCY: 1-2x/week  PT DURATION: 6 months  PLANNED INTERVENTIONS: 97110-Therapeutic exercises, 97530- Therapeutic activity, 97112- Neuromuscular re-education, 97535- Self Care, [id number]- Manual therapy, Patient/Family education, Dry Needling, Joint mobilization, Spinal mobilization, Cryotherapy, Moist heat, and Biofeedback  PLAN FOR NEXT SESSION: manual work to the pelvic floor,  see how the  urge to void, core exercise   Marsha Skeen, PT 10/02/23 9:30 AM

## 2023-10-09 ENCOUNTER — Encounter: Payer: Self-pay | Admitting: Physical Therapy

## 2023-10-09 ENCOUNTER — Encounter: Payer: Commercial Managed Care - PPO | Attending: Physician Assistant | Admitting: Physical Therapy

## 2023-10-09 DIAGNOSIS — R1032 Left lower quadrant pain: Secondary | ICD-10-CM | POA: Diagnosis present

## 2023-10-09 DIAGNOSIS — M6281 Muscle weakness (generalized): Secondary | ICD-10-CM | POA: Diagnosis present

## 2023-10-09 DIAGNOSIS — R278 Other lack of coordination: Secondary | ICD-10-CM | POA: Diagnosis present

## 2023-10-09 NOTE — Therapy (Signed)
 OUTPATIENT PHYSICAL THERAPY FEMALE PELVIC TREATMENT   Patient Name: Christina Logan MRN: [id number] DOB:[birth year], 58 y.o., female Today's Date: 10/09/2023  END OF SESSION:  PT End of Session - 10/09/23 0837     Visit Number 4    Date for PT Re-Evaluation 03/19/24    Authorization Type [employer]    Authorization - Visit Number 4    Authorization - Number of Visits 30    PT Start Time 0830    PT Stop Time 0915    PT Time Calculation (min) 45 min    Activity Tolerance Patient tolerated treatment well    Behavior During Therapy [facility] for tasks assessed/performed             Past Medical History:  Diagnosis Date   Allergic rhinitis    Anxiety    Depression    Dysrhythmia    occ PVC's, PAC's   Personal history of breast cancer 08/11/2021   PVCs (premature ventricular contractions)    Ulcerative colitis (HCC)    Past Surgical History:  Procedure Laterality Date   APPLICATION OF A-CELL OF CHEST/ABDOMEN Bilateral 04/18/2022   Procedure: ACELLULAR DERMIS;  Surgeon: Alger Infield, MD;  Location: [facility];  Service: Plastics;  Laterality: Bilateral;   BREAST IMPLANT EXCHANGE Bilateral 02/22/2016   Procedure: REVISION OF RECONSTRUCTIVE BILATERAL BREAST WITH SILCONE BREAST IMPLANT EXCHANGE;  Surgeon: Alger Infield, MD;  Location: [facility];  Service: Plastics;  Laterality: Bilateral;   BREAST IMPLANT EXCHANGE Bilateral 04/03/2017   Procedure: REVISION bilateral  BREAST RECONSTRUCTION WITH SILICONE IMPLANT EXCHANGE AND ALLODERM;  Surgeon: Alger Infield, MD;  Location: [facility];  Service: Plastics;  Laterality: Bilateral;   BREAST IMPLANT EXCHANGE Bilateral 04/18/2022   Procedure: REVISION BREAST RECONSTRUCTION WITH SILICONE IMPLANT EXCHANGE;  Surgeon: Alger Infield, MD;  Location: [facility];  Service: Plastics;  Laterality: Bilateral;   BREAST RECONSTRUCTION WITH PLACEMENT OF TISSUE EXPANDER AND FLEX HD  (ACELLULAR HYDRATED DERMIS) Bilateral 06/29/2015   Procedure: BILATERAL BREAST RECONSTRUCTION WITH PLACEMENT OF TISSUE EXPANDER AND FLEX HD (ACELLULAR HYDRATED DERMIS);  Surgeon: Alger Infield, MD;  Location: [facility];  Service: Plastics;  Laterality: Bilateral;   COLONOSCOPY     2011/2012   laproscopic exam for fertility     LIPOSUCTION WITH LIPOFILLING Bilateral 09/28/2015   Procedure: LIPO FILLING FROM ABDOMEN TO BILATERAL CHEST;  Surgeon: Alger Infield, MD;  Location: [facility];  Service: Plastics;  Laterality: Bilateral;   LIPOSUCTION WITH LIPOFILLING Bilateral 02/22/2016   Procedure: LIPOFILLING TO BILATERAL CHEST;  Surgeon: Alger Infield, MD;  Location: [facility];  Service: Plastics;  Laterality: Bilateral;   NIPPLE SPARING MASTECTOMY/SENTINAL LYMPH NODE BIOPSY/RECONSTRUCTION/PLACEMENT OF TISSUE EXPANDER Bilateral 06/29/2015   Procedure: RIGHT NIPPLE SPARING MASTECTOMY WITH SENTINAL LYMPH NODE BIOPSY AND LEFT PROPHYLACTIC NIPPLE SPARING MASTECTOMY;  Surgeon: Enid Harry, MD;  Location: [facility];  Service: General;  Laterality: Bilateral;   REMOVAL OF BILATERAL TISSUE EXPANDERS WITH PLACEMENT OF BILATERAL BREAST IMPLANTS Bilateral 09/28/2015   Procedure: REMOVAL OF BILATERAL TISSUE EXPANDERS WITH PLACEMENT OF BILATERAL BREAST IMPLANTS;  Surgeon: Alger Infield, MD;  Location: [facility];  Service: Plastics;  Laterality: Bilateral;   right foot surgery     Patient Active Problem List   Diagnosis Date Noted   Genetic testing 08/19/2021   Personal history of breast cancer 08/11/2021   Breast cancer, right breast (HCC) 08/02/2015   S/P mastectomy 06/29/2015   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   Palpitations 01/21/2014   Family history of breast cancer 10/02/2011    PCP: Ronna Coho, MD  REFERRING PROVIDER: Estle Hemp, PA-C   REFERRING DIAG:  R10.2  (ICD-10-CM) - Pelvic pain syndrome  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 I still have the leakage. I am leaking randomly. Walking, laughing or out of no where. It is small amounts.  Fluid intake: tea or water  PAIN:  Are you having pain? Yes NPRS scale: 3/10 10/02/23: 2/10 10/09/23: pain level 2-3/10 Pain location:  left lower abdominal   Pain type: dull and sharp Pain description: constant   Aggravating factor: random pain Relieving factors: nothing  PRECAUTIONS: Other: breast cancer  RED FLAGS: None   WEIGHT BEARING RESTRICTIONS: No  FALLS:  Has patient fallen in last 6 months? Yes. Number of falls twisted ankle on a dog toy. Not due to balance  OCCUPATION: water aerobic teacher  ACTIVITY LEVEL : water aerobics   PLOF: Independent  PATIENT GOALS: reduce leakage  PERTINENT HISTORY:  Breast cancer; ulcerative colitis, hysterectomy Sexual abuse: No  BOWEL MOVEMENT:no issues  URINATION: Pain with urination: No Fully empty bladder: Yes:   Stream: Strong, when has urgency will go often and stream is shorter Urgency: Yes , with exercise Frequency: every 2-3 hours Leakage: Urge to void, Walking to the bathroom, Coughing, Sneezing, Laughing, Exercise, Lifting, and Bending forward Pads: No  INTERCOURSE:  Ability to have vaginal penetration Yes  Dryness: Yes   PREGNANCY: none   PROLAPSE: None   OBJECTIVE:  Note: Objective measures were completed at Evaluation unless otherwise  noted.  DIAGNOSTIC FINDINGS:  none  PATIENT SURVEYS:  UIQ-7: : 33 PFIQ-7: 22  COGNITION: Overall cognitive status: Within functional limits for tasks assessed     SENSATION: Light touch: Appears intact   POSTURE: No Significant postural limitations   LUMBARAROM/PROM: lumbar ROM is full   LOWER EXTREMITY NWG:NFAO bilateral hip ROM    LOWER EXTREMITY MMT:  MMT Right eval Left eval  Hip extension 4/5 4/5  Hip abduction 4/5 4/5   (Blank rows = not tested) PALPATION:  Pelvic Alignment: left  ilium is posteriorly rotated, decreased movement of left SI joint 10/09/23: left ilium is posteriorly rotated  Abdominal: decreased lower rib cage mobility, tenderness in left lower quadrant.                 External Perineal Exam: intact                             Internal Pelvic Floor: tightness in the levator ani, tenderness located on the left anterior wall of the vagina with fascial restrictions,   Patient confirms identification and approves PT to assess internal pelvic floor and treatment Yes  PELVIC MMT:   MMT eval 10/02/23  Vaginal 2/5 3/5 with difficulty with relaxing  (Blank rows = not tested)        TONE: increased  PROLAPSE: none  TODAY'S TREATMENT:    10/09/23 Manual: Soft tissue mobilization: To assess for dry needling Manual work to the left quadratus, TFL, gluteus medius and lumbar paraspinals, and left lower quadrant Spinal mobilization: Side glide of the L1-L5 vertebrae on the left grade 3 in prone Dry needling: Trigger Point Dry Needling  Initial Treatment: Pt instructed on Dry Needling rational, procedures, and possible side effects. Pt instructed to expect mild to moderate muscle soreness later in the day and/or into the next day.  Pt instructed in methods to reduce muscle soreness. Pt instructed to continue prescribed HEP. Because Dry Needling was performed over or adjacent to a lung field, pt was educated on S/S of pneumothorax and to seek  immediate medical attention should they occur.  Patient was educated on signs and symptoms of infection and other risk factors and advised to seek medical attention should they occur.  Patient verbalized understanding of these instructions and education.   Patient Verbal Consent Given: Yes Education Handout Provided: Yes Muscles Treated: left low thoracic and lumbar multifid and left quadratus  Electrical Stimulation Performed: No Treatment Response/Outcome: elongation of muscle and trigger point response   Neuromuscular re-education: Core facilitation: Dead bug with tactile cues to engage the upper and lower abdominals  Single leg bridge with keeping the pelvis leveled.  Pelvic floor contraction training: PNF to left ilium while laying on right side to engage the pelvic floor muscles Self-care: Reviewed with patient on her vaginal moisturizers and the urge to void drill.     10/02/23 Manual: Internal pelvic floor techniques: No emotional/communication barriers or cognitive limitation. Patient is motivated to learn. Patient understands and agrees with treatment goals and plan. PT explains patient will be examined in standing, sitting, and lying down to see how their muscles and joints work. When they are ready, they will be asked to remove their underwear so PT can examine their perineum. The patient is also given the option of providing their own chaperone as one is not provided in our facility. The patient also has the right and is explained the right to defer or refuse any part of the evaluation or treatment including the internal exam. With the patient's consent, PT will use one gloved finger to gently assess the muscles of the pelvic floor, seeing how well it contracts and relaxes and if there is muscle symmetry. After, the patient will get dressed and PT and patient will discuss exam findings and plan of care. PT and patient discuss plan of care, schedule, attendance policy and HEP  activities.  Going through the vaginal canal with a gloved finger to perform manual work to the levator  ani, along the introitus, using leg movements to stretch the hips at the same  time External release of the urogenital diaphragm going through the layers of restrictions Neuromuscular re-education: Pelvic floor contraction: Therapist finger in the vaginal canal working on pelvic floor contraction and pulling upward Down training: Diaphragmatic breathing with therapist finger in the vaginal canal working on breathing and relaxing the pelvic floor Lateral trunk stretch with diaphragmatic breathing to open up the side rib cage Childs pose with diaphragmatic breathing opening up the posterior rib cage Exercises: Stretches/mobility: Pigeon pose bil. Holding 30 sec Hip adductor stretch in long sit holding 30 sec Hip adductor stretch in kneeling holding 30 sec and move foot in and out bil.  Self-care: Discussed with patient on different moisturizers due to the v-magic irritated her skin      09/25/23 Manual: Soft tissue mobilization: Laying on foam roll on the right side with therapist working on the low lateral intercostals, quadratus, diaphragm, left lateral abdominal tissue and tissue rolling to lengthen Exercises: Stretches/mobility: Sitting on ball with stretching the lower rib cage and working on the quadratus to lengthen the area and increase rib mobility Childs pose with foam roll to stretch the ribs and lats Seated lateral trunk stretch  Diaphragmatic breathing to expand the lower rib cage and relax the pelvic floor in sitting Self-care: Educated patient on vaginal moisturizers and lubricants for vaginal health. Went over on what the products should not have. The differences between silicon and water based lubricant.  Educated patient on bladder irritants and how they affect the bladder    PATIENT EDUCATION:  10/02/23 Education details: Access Code: [email or web address], educated patient  about vaginal moisturizers and lubricants Person educated: Patient Education method: Explanation, Demonstration, Tactile cues, Verbal cues, and Handouts Education comprehension: verbalized understanding, returned demonstration, verbal cues required, tactile cues required, and needs further education  HOME EXERCISE PROGRAM: 10/02/23 Access Code: [email or web address] URL: [email or web address] Date: 10/09/2023 Prepared by: Marsha Skeen  Program Notes Massage the pelvic floor every other day with opening up and rubbing the sides of the vaginal canal 10 times each side.   Exercises - Supine Diaphragmatic Breathing  - 3 x daily - 7 x weekly - 1 sets - 10 reps - Seated Diaphragmatic Breathing  - 1 x daily - 7 x weekly - 1 sets - 10 reps - Seated Lateral Trunk Stretch on Swiss Ball  - 1 x daily - 7 x weekly - 1 sets - 2 reps - 30 sec hold - Thoracic Extension with Foam Roll  - 1 x daily - 7 x weekly - 1 sets - 1 reps - 30 sec hold - Diaphragmatic Breathing in Child's Pose with Pelvic Floor Relaxation  - 1 x daily - 7 x weekly - 1 sets - 1 reps - Pigeon Pose  - 1 x daily - 7 x weekly - 1 sets - 1 reps - 30 sec hold - V Sit Hip Adductor Hamstring Stretch  - 1 x daily - 7 x weekly - 1 sets - 1 reps - 30 sec hold - Kneeling Adductor Stretch with Hip External Rotation  - 1 x daily - 7 x weekly - 1 sets - 1 reps - 30 sec hold - Dead Bug  - 1 x daily - 3 x weekly - 2 sets - 10 reps - Marching Bridge  - 1 x daily - 3 x weekly - 2 sets - 10 reps  ASSESSMENT:  CLINICAL IMPRESSION: Patient is a 58 y.o. female who  was seen today for physical therapy  treatment for pelvic floor dysfunction. She had an ovarian tumor that let so a hysterectomy 2023 and having symptoms of menopause and urinary leakage. Patient had trigger points in the left lumbar and quadratus. She has difficulty keeping her pelvis leveled with single leg bridge. She needs tactile cues to engage the upper abdominals with dead bug. She  reports the urge to void has improved some.   Patient will benefit from skilled therapy to improve pelvic floor coordination and strength and reduce the left lower quadrant pain.   OBJECTIVE IMPAIRMENTS: decreased coordination, decreased strength, increased fascial restrictions, and increased muscle spasms.   ACTIVITY LIMITATIONS: lifting, bending, continence, and locomotion level  PARTICIPATION LIMITATIONS: community activity  PERSONAL FACTORS: Time since onset of injury/illness/exacerbation and 1-2 comorbidities: Breast cancer; ulcerative colitis, hysterectomy  are also affecting patient's functional outcome.   REHAB POTENTIAL: Excellent  CLINICAL DECISION MAKING: Evolving/moderate complexity  EVALUATION COMPLEXITY: Moderate   GOALS: Goals reviewed with patient? Yes  SHORT TERM GOALS: Target date: 10/16/23  Patient is independent with initial HEP for pelvic floor strength.  Baseline: Goal status: INITIAL  2.  Patient is able to perform diaphragmatic breathing to relax the pelvic floor.  Baseline:  Goal status: Met 09/25/23  3.  Patient educated on the urge to void.  Baseline:  Goal status: Met 09/25/23  4.  Patient educated on the effects of menopause on the pelvic floor and how to manage it.  Baseline:  Goal status: INITIAL  LONG TERM GOALS: Target date: 03/19/24  Patient is independent with advanced HEP for core and pelvic floor.  Baseline:  Goal status: INITIAL  2.  Pelvic strength >/4/5 to reduce urinary leakage with lifting and using correct pressure management.  Baseline:  Goal status: INITIAL  3.  Patient is able to walk to the bathroom after she has the urge to void and not leak urine due to increased pelvic floor strength >/= 4/5. Baseline:  Goal status: INITIAL  4.  Patient is able to fully relax her pelvic floor and quickly contract to reduce urinary leakage with coughing and sneezing >/= 80%.  Baseline:  Goal status: INITIAL   PLAN:  PT FREQUENCY:  1-2x/week  PT DURATION: 6 months  PLANNED INTERVENTIONS: 97110-Therapeutic exercises, 97530- Therapeutic activity, 97112- Neuromuscular re-education, 97535- Self Care, [address]- Manual therapy, Patient/Family education, Dry Needling, Joint mobilization, Spinal mobilization, Cryotherapy, Moist heat, and Biofeedback  PLAN FOR NEXT SESSION: manual work to the pelvic floor,  core exercise, see if she needs dry needling   Marsha Skeen, PT 10/09/23 9:32 AM

## 2023-10-09 NOTE — Patient Instructions (Signed)
 Trigger Point Dry Needling  What is Trigger Point Dry Needling (DN)? DN is a physical therapy technique used to treat muscle pain and dysfunction. Specifically, DN helps deactivate muscle trigger points (muscle knots).  A thin filiform needle is used to penetrate the skin and stimulate the underlying trigger point. The goal is for a local twitch response (LTR) to occur and for the trigger point to relax. No medication of any kind is injected during the procedure.   What Does Trigger Point Dry Needling Feel Like?  The procedure feels different for each individual patient. Some patients report that they do not actually feel the needle enter the skin and overall the process is not painful. Very mild bleeding may occur. However, many patients feel a deep cramping in the muscle in which the needle was inserted. This is the local twitch response.   How Will I feel after the treatment? Soreness is normal, and the onset of soreness may not occur for a few hours. Typically this soreness does not last longer than two days.  Bruising is uncommon, however; ice can be used to decrease any possible bruising.  In rare cases feeling tired or nauseous after the treatment is normal. In addition, your symptoms may get worse before they get better, this period will typically not last longer than 24 hours.   What Can I do After My Treatment? Increase your hydration by drinking more water for the next 24 hours.  You may place ice or heat on the areas treated that have become sore, however, do not use heat on inflamed or bruised areas. Heat often brings more relief post needling. You can continue your regular activities, but vigorous activity is not recommended initially after the treatment for 24 hours. DN is best combined with other physical therapy such as strengthening, stretching, and other therapies.   What are the complications? While your therapist has had extensive training in minimizing the risks of trigger  point dry needling, it is important to understand the risks of any procedure.  Risks include bleeding, pain, fatigue, hematoma, infection, vertigo, nausea or nerve involvement. Monitor for any changes to your skin or sensation. Contact your therapist or MD with concerns.  A rare but serious complication is a pneumothorax over or near your middle and upper chest and back If you have dry needling in this area, monitor for the following symptoms: Shortness of breath on exertion and/or Difficulty taking a deep breath and/or Chest Pain and/or A dry cough If any of the above symptoms develop, please go to the nearest emergency room or call 911. Tell them you had dry needling over your thorax and report any symptoms you are having. Please follow-up with your treating therapist after you complete the medical evaluation.   Marsha Skeen, PT [facility] [address], [address] [place], [place] [address] W: [phone] [email or web address]

## 2023-10-15 ENCOUNTER — Ambulatory Visit: Payer: Self-pay | Admitting: Physical Therapy

## 2023-10-15 ENCOUNTER — Encounter: Payer: Self-pay | Admitting: Physical Therapy

## 2023-10-15 DIAGNOSIS — R278 Other lack of coordination: Secondary | ICD-10-CM

## 2023-10-15 DIAGNOSIS — R1032 Left lower quadrant pain: Secondary | ICD-10-CM

## 2023-10-15 DIAGNOSIS — M6281 Muscle weakness (generalized): Secondary | ICD-10-CM | POA: Diagnosis not present

## 2023-10-15 NOTE — Therapy (Signed)
 OUTPATIENT PHYSICAL THERAPY FEMALE PELVIC TREATMENT   Patient Name: Christina Logan MRN: [id number] DOB:1966-01-21, 58 y.o., female Today's Date: 10/15/2023  END OF SESSION:  PT End of Session - 10/15/23 1400     Visit Number 5    Date for PT Re-Evaluation 03/19/24    Authorization Type [employer]    Authorization - Visit Number 5    Authorization - Number of Visits 30    PT Start Time 1400    PT Stop Time 1440    PT Time Calculation (min) 40 min    Activity Tolerance Patient tolerated treatment well    Behavior During Therapy [facility] for tasks assessed/performed             Past Medical History:  Diagnosis Date   Allergic rhinitis    Anxiety    Depression    Dysrhythmia    occ PVC's, PAC's   Personal history of breast cancer 08/11/2021   PVCs (premature ventricular contractions)    Ulcerative colitis (HCC)    Past Surgical History:  Procedure Laterality Date   APPLICATION OF A-CELL OF CHEST/ABDOMEN Bilateral 04/18/2022   Procedure: ACELLULAR DERMIS;  Surgeon: Alger Infield, MD;  Location: [facility];  Service: Plastics;  Laterality: Bilateral;   BREAST IMPLANT EXCHANGE Bilateral 02/22/2016   Procedure: REVISION OF RECONSTRUCTIVE BILATERAL BREAST WITH SILCONE BREAST IMPLANT EXCHANGE;  Surgeon: Alger Infield, MD;  Location: [facility];  Service: Plastics;  Laterality: Bilateral;   BREAST IMPLANT EXCHANGE Bilateral 04/03/2017   Procedure: REVISION bilateral  BREAST RECONSTRUCTION WITH SILICONE IMPLANT EXCHANGE AND ALLODERM;  Surgeon: Alger Infield, MD;  Location: [facility];  Service: Plastics;  Laterality: Bilateral;   BREAST IMPLANT EXCHANGE Bilateral 04/18/2022   Procedure: REVISION BREAST RECONSTRUCTION WITH SILICONE IMPLANT EXCHANGE;  Surgeon: Alger Infield, MD;  Location: [facility];  Service: Plastics;  Laterality: Bilateral;   BREAST RECONSTRUCTION WITH PLACEMENT OF TISSUE EXPANDER AND FLEX HD  (ACELLULAR HYDRATED DERMIS) Bilateral 06/29/2015   Procedure: BILATERAL BREAST RECONSTRUCTION WITH PLACEMENT OF TISSUE EXPANDER AND FLEX HD (ACELLULAR HYDRATED DERMIS);  Surgeon: Alger Infield, MD;  Location: [facility];  Service: Plastics;  Laterality: Bilateral;   COLONOSCOPY     2011/2012   laproscopic exam for fertility     LIPOSUCTION WITH LIPOFILLING Bilateral 09/28/2015   Procedure: LIPO FILLING FROM ABDOMEN TO BILATERAL CHEST;  Surgeon: Alger Infield, MD;  Location: [facility];  Service: Plastics;  Laterality: Bilateral;   LIPOSUCTION WITH LIPOFILLING Bilateral 02/22/2016   Procedure: LIPOFILLING TO BILATERAL CHEST;  Surgeon: Alger Infield, MD;  Location: [facility];  Service: Plastics;  Laterality: Bilateral;   NIPPLE SPARING MASTECTOMY/SENTINAL LYMPH NODE BIOPSY/RECONSTRUCTION/PLACEMENT OF TISSUE EXPANDER Bilateral 06/29/2015   Procedure: RIGHT NIPPLE SPARING MASTECTOMY WITH SENTINAL LYMPH NODE BIOPSY AND LEFT PROPHYLACTIC NIPPLE SPARING MASTECTOMY;  Surgeon: Enid Harry, MD;  Location: [facility];  Service: General;  Laterality: Bilateral;   REMOVAL OF BILATERAL TISSUE EXPANDERS WITH PLACEMENT OF BILATERAL BREAST IMPLANTS Bilateral 09/28/2015   Procedure: REMOVAL OF BILATERAL TISSUE EXPANDERS WITH PLACEMENT OF BILATERAL BREAST IMPLANTS;  Surgeon: Alger Infield, MD;  Location: [facility];  Service: Plastics;  Laterality: Bilateral;   right foot surgery     Patient Active Problem List   Diagnosis Date Noted   Genetic testing 08/19/2021   Personal history of breast cancer 08/11/2021   Breast cancer, right breast (HCC) 08/02/2015   S/P mastectomy 06/29/2015   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   Palpitations 01/21/2014   Family history of breast cancer 10/02/2011    PCP: Ronna Coho, MD  REFERRING PROVIDER: Estle Hemp, PA-C   REFERRING DIAG:  R10.2  (ICD-10-CM) - Pelvic pain syndrome  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 The left lower abdominal pain is 2/10. I am leaking several times per day. It is random.  Fluid intake: tea or water  PAIN:  Are you having pain? Yes NPRS scale: 3/10 10/02/23: 2/10 10/09/23: pain level 2-3/10 Pain location: left lower abdominal   Pain type: dull and sharp Pain description: constant   Aggravating factor: random pain Relieving factors: nothing  PRECAUTIONS: Other: breast cancer  RED FLAGS: None   WEIGHT BEARING RESTRICTIONS: No  FALLS:  Has patient fallen in last 6 months? Yes. Number of falls twisted ankle on a dog toy. Not due to balance  OCCUPATION: water aerobic teacher  ACTIVITY LEVEL : water aerobics   PLOF: Independent  PATIENT GOALS: reduce leakage  PERTINENT HISTORY:  Breast cancer; ulcerative colitis, hysterectomy Sexual abuse: No  BOWEL MOVEMENT:no issues  URINATION: Pain with urination: No Fully empty bladder: Yes:   Stream: Strong, when has urgency will go often and stream is shorter Urgency: Yes , with exercise Frequency: every 2-3 hours Leakage: Urge to void, Walking to the bathroom, Coughing, Sneezing, Laughing, Exercise, Lifting, and Bending forward Pads: No  INTERCOURSE:  Ability to have vaginal penetration Yes  Dryness: Yes   PREGNANCY: none   PROLAPSE: None   OBJECTIVE:  Note: Objective measures were completed at Evaluation unless otherwise noted.  DIAGNOSTIC FINDINGS:   none  PATIENT SURVEYS:  UIQ-7: : 33 PFIQ-7: 22  COGNITION: Overall cognitive status: Within functional limits for tasks assessed     SENSATION: Light touch: Appears intact   POSTURE: No Significant postural limitations   LUMBARAROM/PROM: lumbar ROM is full   LOWER EXTREMITY WUX:LKGM bilateral hip ROM    LOWER EXTREMITY MMT:  MMT Right eval Left eval  Hip extension 4/5 4/5  Hip abduction 4/5 4/5   (Blank rows = not tested) PALPATION:  Pelvic Alignment: left ilium is posteriorly rotated, decreased movement  of left SI joint 10/09/23: left ilium is posteriorly rotated  Abdominal: decreased lower rib cage mobility, tenderness in left lower quadrant.                 External Perineal Exam: intact                             Internal Pelvic Floor: tightness in the levator ani, tenderness located on the left anterior wall of the vagina with fascial restrictions,   Patient confirms identification and approves PT to assess internal pelvic floor and treatment Yes  PELVIC MMT:   MMT eval 10/02/23  Vaginal 2/5 3/5 with difficulty with relaxing  (Blank rows = not tested)        TONE: increased  PROLAPSE: none  TODAY'S TREATMENT:    10/15/23 Neuromuscular re-education: Pelvic floor contraction training: Using the RUSI on the suprapubic area with working on the aberrant movement downward of the pelvic floor during pelvic floor contraction. She needs to breath in then contract the pelvic floor upward during exhalation and hip adduction on yoga block. She was able to move the pelvic floor for 4 sec 15 mm excursion only with breath.  Using the RUSI on the abdominal setting with ultrasound head on the lateral left abdomen to work on contraction and not bulging Using the RUSI on the abdominals below the umbilicus working on contraction and not bulging to  reduce pressure on the pelvic floor    10/09/23 Manual: Soft tissue mobilization: To assess for dry needling Manual  work to the left quadratus, TFL, gluteus medius and lumbar paraspinals, and left lower quadrant Spinal mobilization: Side glide of the L1-L5 vertebrae on the left grade 3 in prone Dry needling: Trigger Point Dry Needling  Initial Treatment: Pt instructed on Dry Needling rational, procedures, and possible side effects. Pt instructed to expect mild to moderate muscle soreness later in the day and/or into the next day.  Pt instructed in methods to reduce muscle soreness. Pt instructed to continue prescribed HEP. Because Dry Needling was performed over or adjacent to a lung field, pt was educated on S/S of pneumothorax and to seek immediate medical attention should they occur.  Patient was educated on signs and symptoms of infection and other risk factors and advised to seek medical attention should they occur.  Patient verbalized understanding of these instructions and education.   Patient Verbal Consent Given: Yes Education Handout Provided: Yes Muscles Treated: left low thoracic and lumbar multifid and left quadratus  Electrical Stimulation Performed: No Treatment Response/Outcome: elongation of muscle and trigger point response   Neuromuscular re-education: Core facilitation: Dead bug with tactile cues to engage the upper and lower abdominals  Single leg bridge with keeping the pelvis leveled.  Pelvic floor contraction training: PNF to left ilium while laying on right side to engage the pelvic floor muscles Self-care: Reviewed with patient on her vaginal moisturizers and the urge to void drill.     10/02/23 Manual: Internal pelvic floor techniques: No emotional/communication barriers or cognitive limitation. Patient is motivated to learn. Patient understands and agrees with treatment goals and plan. PT explains patient will be examined in standing, sitting, and lying down to see how their muscles and joints work. When they are ready, they will be asked to remove their underwear so PT  can examine their perineum. The patient is also given the option of providing their own chaperone as one is not provided in  our facility. The patient also has the right and is explained the right to defer or refuse any part of the evaluation or treatment including the internal exam. With the patient's consent, PT will use one gloved finger to gently assess the muscles of the pelvic floor, seeing how well it contracts and relaxes and if there is muscle symmetry. After, the patient will get dressed and PT and patient will discuss exam findings and plan of care. PT and patient discuss plan of care, schedule, attendance policy and HEP activities.  Going through the vaginal canal with a gloved finger to perform manual work to the levator ani, along the introitus, using leg movements to stretch the hips at the same  time External release of the urogenital diaphragm going through the layers of restrictions Neuromuscular re-education: Pelvic floor contraction: Therapist finger in the vaginal canal working on pelvic floor contraction and pulling upward Down training: Diaphragmatic breathing with therapist finger in the vaginal canal working on breathing and relaxing the pelvic floor Lateral trunk stretch with diaphragmatic breathing to open up the side rib cage Childs pose with diaphragmatic breathing opening up the posterior rib cage Exercises: Stretches/mobility: Pigeon pose bil. Holding 30 sec Hip adductor stretch in long sit holding 30 sec Hip adductor stretch in kneeling holding 30 sec and move foot in and out bil.  Self-care: Discussed with patient on different moisturizers due to the v-magic irritated her skin    PATIENT EDUCATION:  10/02/23 Education details: Access Code: [email or web address], educated patient about vaginal moisturizers and lubricants Person educated: Patient Education method: Explanation, Demonstration, Tactile cues, Verbal cues, and Handouts Education comprehension: verbalized  understanding, returned demonstration, verbal cues required, tactile cues required, and needs further education  HOME EXERCISE PROGRAM: 10/02/23 Access Code: [email or web address] URL: [email or web address] Date: 10/09/2023 Prepared by: Marsha Skeen  Program Notes Massage the pelvic floor every other day with opening up and rubbing the sides of the vaginal canal 10 times each side.   Exercises - Supine Diaphragmatic Breathing  - 3 x daily - 7 x weekly - 1 sets - 10 reps - Seated Diaphragmatic Breathing  - 1 x daily - 7 x weekly - 1 sets - 10 reps - Seated Lateral Trunk Stretch on Swiss Ball  - 1 x daily - 7 x weekly - 1 sets - 2 reps - 30 sec hold - Thoracic Extension with Foam Roll  - 1 x daily - 7 x weekly - 1 sets - 1 reps - 30 sec hold - Diaphragmatic Breathing in Child's Pose with Pelvic Floor Relaxation  - 1 x daily - 7 x weekly - 1 sets - 1 reps - Pigeon Pose  - 1 x daily - 7 x weekly - 1 sets - 1 reps - 30 sec hold - V Sit Hip Adductor Hamstring Stretch  - 1 x daily - 7 x weekly - 1 sets - 1 reps - 30 sec hold - Kneeling Adductor Stretch with Hip External Rotation  - 1 x daily - 7 x weekly - 1 sets - 1 reps - 30 sec hold - Dead Bug  - 1 x daily - 3 x weekly - 2 sets - 10 reps - Marching Bridge  - 1 x daily - 3 x weekly - 2 sets - 10 reps  ASSESSMENT:  CLINICAL IMPRESSION: Patient is a 58 y.o. female who was seen today for physical therapy  treatment for pelvic floor dysfunction. She had an ovarian tumor that let so a hysterectomy 2023 and  having symptoms of menopause and urinary leakage. Patient will bear down when trying to contract the pelvic floor. She is able to move the pelvic floor through the range when she is doing diaphragmatic breathing and during exhale with contract the pelvic floor. She will bulge her abdominals during the end range of pelvic floor contraction putting pressure on the pelvic floor.  Patient will benefit from skilled therapy to improve pelvic floor  coordination and strength and reduce the left lower quadrant pain.   OBJECTIVE IMPAIRMENTS: decreased coordination, decreased strength, increased fascial restrictions, and increased muscle spasms.   ACTIVITY LIMITATIONS: lifting, bending, continence, and locomotion level  PARTICIPATION LIMITATIONS: community activity  PERSONAL FACTORS: Time since onset of injury/illness/exacerbation and 1-2 comorbidities: Breast cancer; ulcerative colitis, hysterectomy are also affecting patient's functional outcome.   REHAB POTENTIAL: Excellent  CLINICAL DECISION MAKING: Evolving/moderate complexity  EVALUATION COMPLEXITY: Moderate   GOALS: Goals reviewed with patient? Yes  SHORT TERM GOALS: Target date: 10/16/23  Patient is independent with initial HEP for pelvic floor strength.  Baseline: Goal status: Met 10/15/23  2.  Patient is able to perform diaphragmatic breathing to relax the pelvic floor.  Baseline:  Goal status: Met 09/25/23  3.  Patient educated on the urge to void.  Baseline:  Goal status: Met 09/25/23  4.  Patient educated on the effects of menopause on the pelvic floor and how to manage it.  Baseline:  Goal status: INITIAL  LONG TERM GOALS: Target date: 03/19/24  Patient is independent with advanced HEP for core and pelvic floor.  Baseline:  Goal status: INITIAL  2.  Pelvic strength >/4/5 to reduce urinary leakage with lifting and using correct pressure management.  Baseline:  Goal status: INITIAL  3.  Patient is able to walk to the bathroom after she has the urge to void and not leak urine due to increased pelvic floor strength >/= 4/5. Baseline:  Goal status: INITIAL  4.  Patient is able to fully relax her pelvic floor and quickly contract to reduce urinary leakage with coughing and sneezing >/= 80%.  Baseline:  Goal status: INITIAL   PLAN:  PT FREQUENCY: 1-2x/week  PT DURATION: 6 months  PLANNED INTERVENTIONS: 97110-Therapeutic exercises, 97530- Therapeutic  activity, 97112- Neuromuscular re-education, 97535- Self Care, [id number]- Manual therapy, Patient/Family education, Dry Needling, Joint mobilization, Spinal mobilization, Cryotherapy, Moist heat, and Biofeedback  PLAN FOR NEXT SESSION: manual work to the pelvic floor,  core exercise, see if she needs dry needling, RUSI with abdominal and pelvic floor   Marsha Skeen, PT 10/15/23 5:09 PM

## 2023-10-22 ENCOUNTER — Ambulatory Visit: Payer: Self-pay | Admitting: Physical Therapy

## 2023-10-24 ENCOUNTER — Ambulatory Visit: Admitting: Physical Therapy

## 2023-10-24 ENCOUNTER — Encounter: Payer: Self-pay | Admitting: Physical Therapy

## 2023-10-24 DIAGNOSIS — R1032 Left lower quadrant pain: Secondary | ICD-10-CM

## 2023-10-24 DIAGNOSIS — M6281 Muscle weakness (generalized): Secondary | ICD-10-CM

## 2023-10-24 DIAGNOSIS — R278 Other lack of coordination: Secondary | ICD-10-CM

## 2023-10-24 NOTE — Therapy (Signed)
 OUTPATIENT PHYSICAL THERAPY FEMALE PELVIC TREATMENT   Patient Name: Christina Logan MRN: [id number] DOB:10-04-1965, 58 y.o., female Today's Date: 10/24/2023  END OF SESSION:  PT End of Session - 10/24/23 1445     Visit Number 6    Date for PT Re-Evaluation 03/19/24    Authorization Type [employer]    Authorization - Visit Number 6    Authorization - Number of Visits 30    PT Start Time 1445    PT Stop Time 1525    PT Time Calculation (min) 40 min    Activity Tolerance Patient tolerated treatment well    Behavior During Therapy [facility] for tasks assessed/performed             Past Medical History:  Diagnosis Date   Allergic rhinitis    Anxiety    Depression    Dysrhythmia    occ PVC's, PAC's   Personal history of breast cancer 08/11/2021   PVCs (premature ventricular contractions)    Ulcerative colitis (HCC)    Past Surgical History:  Procedure Laterality Date   APPLICATION OF A-CELL OF CHEST/ABDOMEN Bilateral 04/18/2022   Procedure: ACELLULAR DERMIS;  Surgeon: Alger Infield, MD;  Location: [facility];  Service: Plastics;  Laterality: Bilateral;   BREAST IMPLANT EXCHANGE Bilateral 02/22/2016   Procedure: REVISION OF RECONSTRUCTIVE BILATERAL BREAST WITH SILCONE BREAST IMPLANT EXCHANGE;  Surgeon: Alger Infield, MD;  Location: [facility];  Service: Plastics;  Laterality: Bilateral;   BREAST IMPLANT EXCHANGE Bilateral 04/03/2017   Procedure: REVISION bilateral  BREAST RECONSTRUCTION WITH SILICONE IMPLANT EXCHANGE AND ALLODERM;  Surgeon: Alger Infield, MD;  Location: [facility];  Service: Plastics;  Laterality: Bilateral;   BREAST IMPLANT EXCHANGE Bilateral 04/18/2022   Procedure: REVISION BREAST RECONSTRUCTION WITH SILICONE IMPLANT EXCHANGE;  Surgeon: Alger Infield, MD;  Location: [facility];  Service: Plastics;  Laterality: Bilateral;   BREAST RECONSTRUCTION WITH PLACEMENT OF TISSUE EXPANDER AND FLEX HD  (ACELLULAR HYDRATED DERMIS) Bilateral 06/29/2015   Procedure: BILATERAL BREAST RECONSTRUCTION WITH PLACEMENT OF TISSUE EXPANDER AND FLEX HD (ACELLULAR HYDRATED DERMIS);  Surgeon: Alger Infield, MD;  Location: [facility];  Service: Plastics;  Laterality: Bilateral;   COLONOSCOPY     2011/2012   laproscopic exam for fertility     LIPOSUCTION WITH LIPOFILLING Bilateral 09/28/2015   Procedure: LIPO FILLING FROM ABDOMEN TO BILATERAL CHEST;  Surgeon: Alger Infield, MD;  Location: [facility];  Service: Plastics;  Laterality: Bilateral;   LIPOSUCTION WITH LIPOFILLING Bilateral 02/22/2016   Procedure: LIPOFILLING TO BILATERAL CHEST;  Surgeon: Alger Infield, MD;  Location: [facility];  Service: Plastics;  Laterality: Bilateral;   NIPPLE SPARING MASTECTOMY/SENTINAL LYMPH NODE BIOPSY/RECONSTRUCTION/PLACEMENT OF TISSUE EXPANDER Bilateral 06/29/2015   Procedure: RIGHT NIPPLE SPARING MASTECTOMY WITH SENTINAL LYMPH NODE BIOPSY AND LEFT PROPHYLACTIC NIPPLE SPARING MASTECTOMY;  Surgeon: Enid Harry, MD;  Location: [facility];  Service: General;  Laterality: Bilateral;   REMOVAL OF BILATERAL TISSUE EXPANDERS WITH PLACEMENT OF BILATERAL BREAST IMPLANTS Bilateral 09/28/2015   Procedure: REMOVAL OF BILATERAL TISSUE EXPANDERS WITH PLACEMENT OF BILATERAL BREAST IMPLANTS;  Surgeon: Alger Infield, MD;  Location: [facility];  Service: Plastics;  Laterality: Bilateral;   right foot surgery     Patient Active Problem List   Diagnosis Date Noted   Genetic testing 08/19/2021   Personal history of breast cancer 08/11/2021   Breast cancer, right breast (HCC) 08/02/2015   S/P mastectomy 06/29/2015   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   Palpitations 01/21/2014   Family history of breast cancer 10/02/2011    PCP: Ronna Coho, MD  REFERRING PROVIDER: Estle Hemp, PA-C   REFERRING DIAG:  R10.2  (ICD-10-CM) - Pelvic pain syndrome  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 The pain has gotten worse in the past few days. The urinary leakage is not as frequent.   Fluid intake: tea or water  PAIN:  Are you having pain? Yes NPRS scale: 3/10 10/02/23: 2/10 10/09/23: pain level 2-3/10 10/24/23: pain level 4/10 Pain location: left lower abdominal   Pain type: dull and sharp Pain description: constant   Aggravating factor: random pain Relieving factors: nothing  PRECAUTIONS: Other: breast cancer  RED FLAGS: None   WEIGHT BEARING RESTRICTIONS: No  FALLS:  Has patient fallen in last 6 months? Yes. Number of falls twisted ankle on a dog toy. Not due to balance  OCCUPATION: water aerobic teacher  ACTIVITY LEVEL : water aerobics   PLOF: Independent  PATIENT GOALS: reduce leakage  PERTINENT HISTORY:  Breast cancer; ulcerative colitis, hysterectomy Sexual abuse: No  BOWEL MOVEMENT:no issues  URINATION: Pain with urination: No Fully empty bladder: Yes:   Stream: Strong, when has urgency will go often and stream is shorter Urgency: Yes , with exercise 10/24/23: still have the urgency with exercise Frequency: every 2-3 hours Leakage: Urge to void, Walking to the bathroom, Coughing, Sneezing, Laughing, Exercise, Lifting, and Bending forward 10/24/23: Urge to void,  Coughing, Sneezing, Laughing, Exercise, Lifting Pads: No  INTERCOURSE:  Ability to have vaginal penetration Yes  Dryness: Yes   PREGNANCY:  none   PROLAPSE: None   OBJECTIVE:  Note: Objective measures were completed at Evaluation unless otherwise noted.  DIAGNOSTIC FINDINGS:  none  PATIENT SURVEYS:  UIQ-7: : 33 PFIQ-7: 22  COGNITION: Overall cognitive status: Within functional limits for tasks assessed     SENSATION: Light touch: Appears intact   POSTURE: No Significant postural limitations   LUMBARAROM/PROM: lumbar ROM is full   LOWER EXTREMITY [id number]:[id number] bilateral hip ROM    LOWER EXTREMITY MMT:  MMT Right eval Left eval  Hip extension 4/5 4/5  Hip abduction 4/5 4/5   (Blank rows = not tested) PALPATION:  Pelvic Alignment: left ilium is posteriorly rotated, decreased movement of left SI joint 10/09/23: left ilium is posteriorly rotated  Abdominal: decreased lower rib cage mobility, tenderness in left lower quadrant.                 External Perineal Exam: intact                             Internal Pelvic Floor: tightness in the levator ani, tenderness located on the left anterior wall of the vagina with fascial restrictions,   Patient confirms identification and approves PT to assess internal pelvic floor and treatment Yes  PELVIC MMT:   MMT eval 10/02/23  Vaginal 2/5 3/5 with difficulty with relaxing  (Blank rows = not tested)        TONE: increased  PROLAPSE: none  TODAY'S TREATMENT:  10/24/23 Neuromuscular re-education: Pelvic floor contraction training: Using the RUSI on the suprapubic area  on the pelvic floor program working on pelvic floor contraction with lower abdominal contraction, used a ball squeeze to assist, used therapist hand to assist the lower abdominal muscles to pull upward, had her on a disc to assist with contraction Hook[place] with ball between knees pulling green band down Hook[place] with marching to engage the lower abdomen 1/2 kneel pulling band diagonally with lower abdomen and pelvic floor contraction Self-care: Educated patient on vaginal weights to use to  increase pelvic floor strength and use for tactile cues    10/15/23 Neuromuscular re-education: Pelvic floor contraction training: Using the RUSI on the suprapubic area with working on the aberrant movement downward of the pelvic floor during pelvic floor contraction. She needs to breath in then contract the pelvic floor upward during exhalation and hip adduction on yoga block. She was able to move the pelvic floor for 4 sec 15 mm excursion only with breath.  Using the RUSI on the abdominal setting with ultrasound head on the lateral left abdomen to work on contraction and not bulging Using the RUSI on the abdominals below the umbilicus working on contraction and not bulging to  reduce pressure on the pelvic floor    10/09/23 Manual: Soft tissue mobilization: To assess for dry needling Manual work to the left quadratus, TFL, gluteus medius and lumbar paraspinals, and left lower quadrant Spinal mobilization: Side glide of the L1-L5 vertebrae on the left grade 3 in prone Dry needling: Trigger Point Dry Needling  Initial Treatment: Pt instructed on Dry Needling rational, procedures, and possible side effects. Pt instructed to expect mild to moderate muscle soreness later in the day and/or into the next day.  Pt instructed in methods to reduce muscle soreness. Pt instructed to continue prescribed HEP. Because Dry Needling was performed over or adjacent to a lung field, pt was educated on S/S of pneumothorax and to seek immediate medical attention should they occur.  Patient was educated on signs and symptoms of infection and other risk factors and advised to seek medical attention should they occur.  Patient verbalized understanding of these instructions and education.   Patient Verbal Consent Given: Yes Education Handout Provided: Yes Muscles Treated: left low thoracic and lumbar multifid and left quadratus  Electrical Stimulation Performed: No Treatment Response/Outcome: elongation of  muscle and trigger point response   Neuromuscular re-education: Core facilitation: Dead bug with tactile cues to engage the upper and lower abdominals  Single leg  bridge with keeping the pelvis leveled.  Pelvic floor contraction training: PNF to left ilium while laying on right side to engage the pelvic floor muscles Self-care: Reviewed with patient on her vaginal moisturizers and the urge to void drill.       PATIENT EDUCATION:  10/24/23 Education details: Access Code: [email or web address], educated patient about vaginal moisturizers and lubricants, educated patient on vaginal weights Person educated: Patient Education method: Explanation, Demonstration, Tactile cues, Verbal cues, and Handouts Education comprehension: verbalized understanding, returned demonstration, verbal cues required, tactile cues required, and needs further education  HOME EXERCISE PROGRAM: 10/24/23 Access Code: [email or web address] URL: [email or web address] Date: 10/24/2023 Prepared by: Marsha Skeen  Program Notes Massage the pelvic floor every other day with opening up and rubbing the sides of the vaginal canal 10 times each side.   Exercises - Supine Diaphragmatic Breathing  - 3 x daily - 7 x weekly - 1 sets - 10 reps - Seated Diaphragmatic Breathing  - 1 x daily - 7 x weekly - 1 sets - 10 reps - Seated Lateral Trunk Stretch on Swiss Ball  - 1 x daily - 7 x weekly - 1 sets - 2 reps - 30 sec hold - Thoracic Extension with Foam Roll  - 1 x daily - 7 x weekly - 1 sets - 1 reps - 30 sec hold - Diaphragmatic Breathing in Child's Pose with Pelvic Floor Relaxation  - 1 x daily - 7 x weekly - 1 sets - 1 reps - Pigeon Pose  - 1 x daily - 7 x weekly - 1 sets - 1 reps - 30 sec hold - V Sit Hip Adductor Hamstring Stretch  - 1 x daily - 7 x weekly - 1 sets - 1 reps - 30 sec hold - Kneeling Adductor Stretch with Hip External Rotation  - 1 x daily - 7 x weekly - 1 sets - 1 reps - 30 sec hold - Dead Bug  - 1 x daily - 3 x weekly  - 2 sets - 10 reps - Marching Bridge  - 1 x daily - 3 x weekly - 2 sets - 10 reps - Half Kneeling Diagonal Chops with Resistance  - 1 x daily - 7 x weekly - 2 sets - 10 reps - Supine March  - 1 x daily - 7 x weekly - 1 sets - 10 reps  ASSESSMENT:  CLINICAL IMPRESSION: Patient is a 58 y.o. female who was seen today for physical therapy  treatment for pelvic floor dysfunction. She had an ovarian tumor that let so a hysterectomy 2023 and having symptoms of menopause and urinary leakage. Patient will bear down when trying to contract the pelvic floor. She is able to move the pelvic floor through the range when she is doing diaphragmatic breathing and during exhale with contract the pelvic floor. She will bulge her abdominals during the end range of pelvic floor contraction putting pressure on the pelvic floor.  Her left abdominal pain is increased over the past few days. Patient will benefit from skilled therapy to improve pelvic floor coordination and strength and reduce the left lower quadrant pain.   OBJECTIVE IMPAIRMENTS: decreased coordination, decreased strength, increased fascial restrictions, and increased muscle spasms.   ACTIVITY LIMITATIONS: lifting, bending, continence, and locomotion level  PARTICIPATION LIMITATIONS: community activity  PERSONAL FACTORS: Time since onset of injury/illness/exacerbation and 1-2 comorbidities: Breast cancer; ulcerative colitis, hysterectomy are also affecting patient's functional outcome.   REHAB POTENTIAL: Excellent  CLINICAL DECISION MAKING: Evolving/moderate complexity  EVALUATION COMPLEXITY: Moderate   GOALS: Goals reviewed with patient? Yes  SHORT TERM GOALS: Target date: 10/16/23  Patient is independent with initial HEP for pelvic floor strength.  Baseline: Goal status: Met 10/15/23  2.  Patient is able to perform diaphragmatic breathing to relax the pelvic floor.  Baseline:  Goal status: Met 09/25/23  3.  Patient educated on the urge  to void.  Baseline:  Goal status: Met 09/25/23  4.  Patient educated on the effects of menopause on the pelvic floor and how to manage it.  Baseline:  Goal status: INITIAL  LONG TERM GOALS: Target date: 03/19/24  Patient is independent with advanced HEP for core and pelvic floor.  Baseline:  Goal status: INITIAL  2.  Pelvic strength >/4/5 to reduce urinary leakage with lifting and using correct pressure management.  Baseline:  Goal status: INITIAL  3.  Patient is able to walk to the bathroom after she has the urge to void and not leak urine due to increased pelvic floor strength >/= 4/5. Baseline:  Goal status: INITIAL  4.  Patient is able to fully relax her pelvic floor and quickly contract to reduce urinary leakage with coughing and sneezing >/= 80%.  Baseline:  Goal status: INITIAL   PLAN:  PT FREQUENCY: 1-2x/week  PT DURATION: 6 months  PLANNED INTERVENTIONS: 97110-Therapeutic exercises, 97530- Therapeutic activity, 97112- Neuromuscular re-education, 97535- Self Care, 91478- Manual therapy, Patient/Family education, Dry Needling, Joint mobilization, Spinal mobilization, Cryotherapy, Moist heat, and Biofeedback  PLAN FOR NEXT SESSION: manual work to the pelvic floor,  core exercise, see if she has gotten the vaginal weights, see what MD says   Marsha Skeen, PT 10/24/23 3:28 PM

## 2023-10-30 ENCOUNTER — Encounter: Payer: Self-pay | Admitting: Gynecologic Oncology

## 2023-10-30 DIAGNOSIS — R1032 Left lower quadrant pain: Secondary | ICD-10-CM

## 2023-10-30 DIAGNOSIS — D3912 Neoplasm of uncertain behavior of left ovary: Secondary | ICD-10-CM

## 2023-11-01 ENCOUNTER — Encounter: Payer: Self-pay | Admitting: Gynecologic Oncology

## 2023-11-02 ENCOUNTER — Other Ambulatory Visit: Payer: Self-pay | Admitting: Gynecologic Oncology

## 2023-11-02 DIAGNOSIS — R1032 Left lower quadrant pain: Secondary | ICD-10-CM

## 2023-11-02 DIAGNOSIS — D3912 Neoplasm of uncertain behavior of left ovary: Secondary | ICD-10-CM

## 2023-11-06 ENCOUNTER — Encounter: Payer: Self-pay | Admitting: Oncology

## 2023-11-06 ENCOUNTER — Ambulatory Visit
Admission: RE | Admit: 2023-11-06 | Discharge: 2023-11-06 | Disposition: A | Discharge status: Home / Self Care | Source: Ambulatory Visit | Attending: Gynecologic Oncology

## 2023-11-06 DIAGNOSIS — D3912 Neoplasm of uncertain behavior of left ovary: Secondary | ICD-10-CM

## 2023-11-06 DIAGNOSIS — R1032 Left lower quadrant pain: Secondary | ICD-10-CM

## 2023-11-06 MED ORDER — IOPAMIDOL (ISOVUE-300) INJECTION 61%
100.0000 mL | Freq: Once | INTRAVENOUS | Status: AC | PRN
  Administered 2023-11-06: 100 mL via INTRAVENOUS

## 2023-11-06 NOTE — Progress Notes (Signed)
 Faxed CT results from today to Dr. Pearly Bound.

## 2023-11-09 ENCOUNTER — Encounter: Payer: Self-pay | Admitting: Physical Therapy

## 2023-11-16 ENCOUNTER — Encounter: Payer: Self-pay | Admitting: Physical Therapy
